# Patient Record
Sex: Female | Born: 1970
Health system: Southern US, Community
[De-identification: ages and names within clinical notes are randomized; demographics above are authoritative.]

## PROBLEM LIST (undated history)

## (undated) DIAGNOSIS — F419 Anxiety disorder, unspecified: Secondary | ICD-10-CM

## (undated) DIAGNOSIS — I288 Other diseases of pulmonary vessels: Secondary | ICD-10-CM

## (undated) DIAGNOSIS — G4700 Insomnia, unspecified: Secondary | ICD-10-CM

## (undated) DIAGNOSIS — F32A Depression, unspecified: Secondary | ICD-10-CM

## (undated) DIAGNOSIS — R42 Dizziness and giddiness: Secondary | ICD-10-CM

## (undated) DIAGNOSIS — R0683 Snoring: Secondary | ICD-10-CM

## (undated) DIAGNOSIS — H9190 Unspecified hearing loss, unspecified ear: Secondary | ICD-10-CM

## (undated) DIAGNOSIS — E119 Type 2 diabetes mellitus without complications: Secondary | ICD-10-CM

## (undated) DIAGNOSIS — IMO0002 Reserved for concepts with insufficient information to code with codable children: Secondary | ICD-10-CM

## (undated) DIAGNOSIS — G8929 Other chronic pain: Secondary | ICD-10-CM

## (undated) DIAGNOSIS — H9319 Tinnitus, unspecified ear: Secondary | ICD-10-CM

## (undated) DIAGNOSIS — F329 Major depressive disorder, single episode, unspecified: Secondary | ICD-10-CM

## (undated) DIAGNOSIS — I10 Essential (primary) hypertension: Secondary | ICD-10-CM

## (undated) HISTORY — PX: TUBAL LIGATION: SHX77

## (undated) HISTORY — DX: Reserved for concepts with insufficient information to code with codable children: IMO0002

---

## 2001-11-11 ENCOUNTER — Emergency Department (HOSPITAL_COMMUNITY): Admission: EM | Admit: 2001-11-11 | Discharge: 2001-11-11 | Payer: Self-pay | Admitting: Emergency Medicine

## 2002-12-12 ENCOUNTER — Other Ambulatory Visit: Admission: RE | Admit: 2002-12-12 | Discharge: 2002-12-12 | Payer: Self-pay | Admitting: Obstetrics and Gynecology

## 2002-12-19 ENCOUNTER — Ambulatory Visit (HOSPITAL_COMMUNITY): Admission: RE | Admit: 2002-12-19 | Discharge: 2002-12-19 | Payer: Self-pay | Admitting: Obstetrics and Gynecology

## 2003-02-05 ENCOUNTER — Encounter: Admission: RE | Admit: 2003-02-05 | Discharge: 2003-02-05 | Payer: Self-pay | Admitting: Obstetrics and Gynecology

## 2004-04-27 ENCOUNTER — Other Ambulatory Visit: Admission: RE | Admit: 2004-04-27 | Discharge: 2004-04-27 | Payer: Self-pay | Admitting: Obstetrics and Gynecology

## 2008-02-27 ENCOUNTER — Emergency Department (HOSPITAL_COMMUNITY): Admission: EM | Admit: 2008-02-27 | Discharge: 2008-02-27 | Payer: Self-pay | Admitting: Emergency Medicine

## 2008-04-25 ENCOUNTER — Emergency Department (HOSPITAL_COMMUNITY): Admission: EM | Admit: 2008-04-25 | Discharge: 2008-04-25 | Payer: Self-pay | Admitting: Family Medicine

## 2010-07-08 ENCOUNTER — Other Ambulatory Visit: Payer: Self-pay | Admitting: Internal Medicine

## 2010-07-08 DIAGNOSIS — E042 Nontoxic multinodular goiter: Secondary | ICD-10-CM

## 2010-07-12 ENCOUNTER — Ambulatory Visit
Admission: RE | Admit: 2010-07-12 | Discharge: 2010-07-12 | Disposition: A | Discharge status: Home / Self Care | Payer: 59 | Source: Ambulatory Visit | Attending: Internal Medicine | Admitting: Internal Medicine

## 2010-07-12 DIAGNOSIS — E042 Nontoxic multinodular goiter: Secondary | ICD-10-CM

## 2010-08-18 ENCOUNTER — Encounter (INDEPENDENT_AMBULATORY_CARE_PROVIDER_SITE_OTHER): Payer: Self-pay | Admitting: Surgery

## 2010-08-19 ENCOUNTER — Ambulatory Visit (INDEPENDENT_AMBULATORY_CARE_PROVIDER_SITE_OTHER): Payer: 59 | Admitting: Surgery

## 2010-08-19 ENCOUNTER — Encounter (INDEPENDENT_AMBULATORY_CARE_PROVIDER_SITE_OTHER): Payer: Self-pay | Admitting: Surgery

## 2010-08-19 VITALS — BP 150/108 | HR 82 | Temp 97.0°F | Ht 72.0 in | Wt 240.0 lb

## 2010-08-19 DIAGNOSIS — E041 Nontoxic single thyroid nodule: Secondary | ICD-10-CM

## 2010-08-19 NOTE — Progress Notes (Signed)
Subjective:     Patient ID: Holly Rodgers, female   DOB: 1970/10/13, 41 y.o.   MRN: [id number]    BP 150/108  Pulse 82  Temp 97 F (36.1 C)  Ht 6' (1.829 m)  Wt 240 lb (108.863 kg)  BMI 32.55 kg/m2    HPI This is a 40 year old female referred for a nodule on her right thyroid gland. She felt this herself approximately 2 months ago. She reports some intermittent hoarseness. She has no difficulty swallowing. She has no signs or symptoms of hyper or hypothyroidism. She otherwise has no complaints. She has had no previous biopsies or surgery on her neck. Past Medical History  Diagnosis Date  . Cyst     Past Surgical History  Procedure Date  . Tubal ligation    No Known Allergies  No current outpatient prescriptions on file.    History   Social History  . Marital Status: Legally Separated    Spouse Name: N/A    Number of Children: N/A  . Years of Education: N/A   Occupational History  . Not on file.   Social History Main Topics  . Smoking status: Never Smoker   . Smokeless tobacco: Not on file  . Alcohol Use: 8.4 oz/week    14 Cans of beer per week  . Drug Use: No  . Sexually Active:    Other Topics Concern  . Not on file   Social History Narrative  . No narrative on file     Review of Systems  Constitutional: Negative.   HENT: Negative.   Eyes: Negative.   Respiratory: Negative.   Cardiovascular: Negative.   Gastrointestinal: Negative.   Genitourinary: Negative.   Musculoskeletal: Negative.   Neurological: Negative.   Hematological: Negative.   Psychiatric/Behavioral: Negative.        Objective:   Physical Exam  Constitutional: She is oriented to person, place, and time. She appears well-developed and well-nourished. No distress.  HENT:  Head: Normocephalic and atraumatic.  Right Ear: External ear normal.  Left Ear: External ear normal.  Nose: Nose normal.  Mouth/Throat: Oropharynx is clear and moist. No oropharyngeal exudate.  Eyes: Pupils  are equal, round, and reactive to light. No scleral icterus.  Neck: Neck supple. No tracheal deviation present. Thyromegaly present.  Cardiovascular: Normal rate, regular rhythm and normal heart sounds.   No murmur heard. Pulmonary/Chest: Effort normal and breath sounds normal. No respiratory distress.  Lymphadenopathy:    She has no cervical adenopathy.  Neurological: She is alert and oriented to person, place, and time.  Skin: Skin is warm and dry. No rash noted.  Psychiatric: Her behavior is normal. Judgment and thought content normal.      Data reviewed: I have an ultrasound of the patient's thyroid gland. This shows an almost 4 cm cystic mass in the right upper thyroid lobe and a 3 cm mass which is solid in the right lower pole of the thyroid gland. The isthmus and left gland are normal. Assessment:       Thyroid nodule on the right thyroid gland Plan:     I would recommend fine-needle aspiration of both the cystic and the solid masses in the right groin. Should these showed follicular components or malignancy I would then recommend right thyroid lobectomy. I will schedule this under ultrasound guidance by the radiologist. I will see her back after this is performed.

## 2010-08-31 ENCOUNTER — Other Ambulatory Visit: Payer: 59

## 2010-09-08 ENCOUNTER — Other Ambulatory Visit: Payer: 59

## 2010-09-15 ENCOUNTER — Ambulatory Visit
Admission: RE | Admit: 2010-09-15 | Discharge: 2010-09-15 | Disposition: A | Discharge status: Home / Self Care | Payer: 59 | Source: Ambulatory Visit | Attending: Surgery | Admitting: Surgery

## 2010-09-15 ENCOUNTER — Other Ambulatory Visit (HOSPITAL_COMMUNITY)
Admission: RE | Admit: 2010-09-15 | Discharge: 2010-09-15 | Disposition: A | Discharge status: Home / Self Care | Payer: 59 | Source: Ambulatory Visit | Attending: Surgery | Admitting: Surgery

## 2010-09-15 DIAGNOSIS — E041 Nontoxic single thyroid nodule: Secondary | ICD-10-CM

## 2010-09-15 DIAGNOSIS — E049 Nontoxic goiter, unspecified: Secondary | ICD-10-CM | POA: Insufficient documentation

## 2010-10-07 ENCOUNTER — Telehealth (INDEPENDENT_AMBULATORY_CARE_PROVIDER_SITE_OTHER): Payer: Self-pay | Admitting: General Surgery

## 2010-10-07 NOTE — Telephone Encounter (Signed)
Left message on machine for pt to call back for her U/S Thyroid Bx. Pt to come in for a office visit to go over Bx or can give results over the phone

## 2017-12-05 ENCOUNTER — Inpatient Hospital Stay (HOSPITAL_BASED_OUTPATIENT_CLINIC_OR_DEPARTMENT_OTHER)
Admission: EM | Admit: 2017-12-05 | Discharge: 2017-12-11 | DRG: 175 | Disposition: A | Discharge status: Home Health | Payer: 59 | Attending: Internal Medicine | Admitting: Internal Medicine

## 2017-12-05 ENCOUNTER — Encounter (HOSPITAL_BASED_OUTPATIENT_CLINIC_OR_DEPARTMENT_OTHER): Payer: Self-pay

## 2017-12-05 ENCOUNTER — Emergency Department (HOSPITAL_BASED_OUTPATIENT_CLINIC_OR_DEPARTMENT_OTHER): Payer: 59

## 2017-12-05 ENCOUNTER — Other Ambulatory Visit: Payer: Self-pay

## 2017-12-05 DIAGNOSIS — E1169 Type 2 diabetes mellitus with other specified complication: Secondary | ICD-10-CM | POA: Diagnosis present

## 2017-12-05 DIAGNOSIS — F419 Anxiety disorder, unspecified: Secondary | ICD-10-CM | POA: Diagnosis present

## 2017-12-05 DIAGNOSIS — R0602 Shortness of breath: Secondary | ICD-10-CM | POA: Diagnosis not present

## 2017-12-05 DIAGNOSIS — Z6841 Body Mass Index (BMI) 40.0 and over, adult: Secondary | ICD-10-CM

## 2017-12-05 DIAGNOSIS — H9190 Unspecified hearing loss, unspecified ear: Secondary | ICD-10-CM | POA: Diagnosis present

## 2017-12-05 DIAGNOSIS — I2602 Saddle embolus of pulmonary artery with acute cor pulmonale: Principal | ICD-10-CM | POA: Diagnosis present

## 2017-12-05 DIAGNOSIS — Z86718 Personal history of other venous thrombosis and embolism: Secondary | ICD-10-CM

## 2017-12-05 DIAGNOSIS — E119 Type 2 diabetes mellitus without complications: Secondary | ICD-10-CM | POA: Diagnosis present

## 2017-12-05 DIAGNOSIS — Z86711 Personal history of pulmonary embolism: Secondary | ICD-10-CM

## 2017-12-05 DIAGNOSIS — I82432 Acute embolism and thrombosis of left popliteal vein: Secondary | ICD-10-CM | POA: Diagnosis present

## 2017-12-05 DIAGNOSIS — Z8249 Family history of ischemic heart disease and other diseases of the circulatory system: Secondary | ICD-10-CM

## 2017-12-05 DIAGNOSIS — B9620 Unspecified Escherichia coli [E. coli] as the cause of diseases classified elsewhere: Secondary | ICD-10-CM | POA: Diagnosis present

## 2017-12-05 DIAGNOSIS — Z833 Family history of diabetes mellitus: Secondary | ICD-10-CM

## 2017-12-05 DIAGNOSIS — Z888 Allergy status to other drugs, medicaments and biological substances status: Secondary | ICD-10-CM

## 2017-12-05 DIAGNOSIS — Z23 Encounter for immunization: Secondary | ICD-10-CM

## 2017-12-05 DIAGNOSIS — E669 Obesity, unspecified: Secondary | ICD-10-CM

## 2017-12-05 DIAGNOSIS — R911 Solitary pulmonary nodule: Secondary | ICD-10-CM | POA: Diagnosis present

## 2017-12-05 DIAGNOSIS — N390 Urinary tract infection, site not specified: Secondary | ICD-10-CM | POA: Diagnosis present

## 2017-12-05 DIAGNOSIS — Z87891 Personal history of nicotine dependence: Secondary | ICD-10-CM

## 2017-12-05 DIAGNOSIS — G8929 Other chronic pain: Secondary | ICD-10-CM | POA: Diagnosis present

## 2017-12-05 DIAGNOSIS — F329 Major depressive disorder, single episode, unspecified: Secondary | ICD-10-CM | POA: Diagnosis present

## 2017-12-05 DIAGNOSIS — I10 Essential (primary) hypertension: Secondary | ICD-10-CM | POA: Diagnosis present

## 2017-12-05 HISTORY — DX: Anxiety disorder, unspecified: F41.9

## 2017-12-05 HISTORY — DX: Type 2 diabetes mellitus without complications: E11.9

## 2017-12-05 HISTORY — DX: Insomnia, unspecified: G47.00

## 2017-12-05 HISTORY — DX: Snoring: R06.83

## 2017-12-05 HISTORY — DX: Essential (primary) hypertension: I10

## 2017-12-05 HISTORY — DX: Major depressive disorder, single episode, unspecified: F32.9

## 2017-12-05 HISTORY — DX: Dizziness and giddiness: R42

## 2017-12-05 HISTORY — DX: Unspecified hearing loss, unspecified ear: H91.90

## 2017-12-05 HISTORY — DX: Depression, unspecified: F32.A

## 2017-12-05 HISTORY — DX: Other chronic pain: G89.29

## 2017-12-05 HISTORY — DX: Other diseases of pulmonary vessels: I28.8

## 2017-12-05 HISTORY — DX: Tinnitus, unspecified ear: H93.19

## 2017-12-05 NOTE — ED Provider Notes (Signed)
[facility] Provider Note: [name], MD, FACEP  CSN: [id number] MRN: [id number] ARRIVAL: 12/05/17 at 2017 ROOM: [facility]09/[facility]09   CHIEF COMPLAINT  Shortness of Breath   HISTORY OF PRESENT ILLNESS  12/05/17 11:48 PM Holly Rodgers is a 47 y.o. female with a history of morbid obesity.  She is here with a 3-day history of increased edema of her lower extremities, left greater than right.  She states she is having severe pain in her left calf which feels deep.  She is also having pain in her right popliteal fossa.  She also complains of shortness of breath, worse with exertion.  Symptoms are moderate.  She denies chest pain.  She has had nausea and some diarrhea but no vomiting.    Past Medical History:  Diagnosis Date  . Anxiety   . Chronic pain   . Cyst   . Depression   . Diabetes mellitus without complication (HCC)   . Hearing loss   . Hypertension   . Idiopathic dilatation of pulmonary artery (HCC)   . Tinnitus   . Vertigo     Past Surgical History:  Procedure Laterality Date  . TUBAL LIGATION      Family History  Problem Relation Age of Onset  . Diabetes Mother   . Heart disease Mother     Social History   Tobacco Use  . Smoking status: Never Smoker  . Smokeless tobacco: Never Used  Substance Use Topics  . Alcohol use: Not Currently  . Drug use: No    Prior to Admission medications   Medication Sig Start Date End Date Taking? Authorizing Provider  diclofenac (VOLTAREN) 50 MG EC tablet Take 50 mg by mouth 2 (two) times daily.   Yes [provider]  diltiazem (CARDIZEM CD) 300 MG 24 hr capsule Take 300 mg by mouth daily.   Yes [provider]  gabapentin (NEURONTIN) 600 MG tablet Take 600 mg by mouth 3 (three) times daily.   Yes [provider]  medroxyPROGESTERone (PROVERA) 10 MG tablet Take 10 mg by mouth daily.   Yes [provider]  metoprolol succinate (TOPROL-XL) 50 MG 24 hr tablet Take 50 mg by  mouth daily. Take with or immediately following a meal.   Yes [provider]  sertraline (ZOLOFT) 100 MG tablet Take 100 mg by mouth daily.   Yes [provider]  tiZANidine (ZANAFLEX) 4 MG capsule Take 4 mg by mouth 3 (three) times daily.   Yes [provider]    Allergies Metformin and related   REVIEW OF SYSTEMS  Negative except as noted here or in the History of Present Illness.   PHYSICAL EXAMINATION  Initial Vital Signs Blood pressure (!) 164/101, pulse 92, temperature 98.7 F (37.1 C), temperature source Oral, resp. rate 19, height 5\' 11"  (1.803 m), weight (!) 190.5 kg, last menstrual period 11/23/2017, SpO2 96 %.  Examination General: Well-developed, obese female in no acute distress; appearance consistent with age of record HENT: normocephalic; atraumatic Eyes: pupils equal, round and reactive to light; extraocular muscles intact Neck: supple Heart: regular rate and rhythm; no murmur Lungs: clear to auscultation bilaterally Abdomen: soft; these; nontender; bowel sounds present Extremities: No deformity; full range of motion; edema of lower extremities, left greater than right with tenderness of left calf and right popliteal fossa Neurologic: Awake, alert and oriented; motor function intact in all extremities and symmetric; no facial droop Skin: Warm and dry Psychiatric: Normal mood and affect  RESULTS  Summary of this visit's results, reviewed by myself:   EKG Interpretation  Date/Time:  Tuesday December 05 2017 20:38:29 EDT Ventricular Rate:  88 PR Interval:  158 QRS Duration: 84 QT Interval:  358 QTC Calculation: 433 R Axis:   72 Text Interpretation:  Normal sinus rhythm Left ventricular hypertrophy Nonspecific ST abnormality Abnormal ECG No previous ECGs available Confirmed by Ebany Bowermaster, Jonny Ruiz ([id number]) on 12/05/2017 11:51:51 PM      Laboratory Studies: Results for orders placed or performed during the hospital encounter of 12/05/17  (from the past 24 hour(s))  CBC with Differential/Platelet     Status: Abnormal   Collection Time: 12/06/17 12:10 AM  Result Value Ref Range   WBC 10.2 4.0 - 10.5 K/uL   RBC 4.52 3.87 - 5.11 MIL/uL   Hemoglobin 10.8 (L) 12.0 - 15.0 g/dL   HCT 40.9 (L) 81.1 - 91.4 %   MCV 79.2 (L) 80.0 - 100.0 fL   MCH 23.9 (L) 26.0 - 34.0 pg   MCHC 30.2 30.0 - 36.0 g/dL   RDW 78.2 (H) 95.6 - 21.3 %   Platelets 215 150 - 400 K/uL   nRBC 0.2 0.0 - 0.2 %   Neutrophils Relative % 74 %   Neutro Abs 7.6 1.7 - 7.7 K/uL   Lymphocytes Relative 18 %   Lymphs Abs 1.8 0.7 - 4.0 K/uL   Monocytes Relative 6 %   Monocytes Absolute 0.6 0.1 - 1.0 K/uL   Eosinophils Relative 1 %   Eosinophils Absolute 0.1 0.0 - 0.5 K/uL   Basophils Relative 0 %   Basophils Absolute 0.0 0.0 - 0.1 K/uL   Immature Granulocytes 1 %   Abs Immature Granulocytes 0.05 0.00 - 0.07 K/uL  Basic metabolic panel     Status: Abnormal   Collection Time: 12/06/17 12:10 AM  Result Value Ref Range   Sodium 136 135 - 145 mmol/L   Potassium 3.6 3.5 - 5.1 mmol/L   Chloride 101 98 - 111 mmol/L   CO2 25 22 - 32 mmol/L   Glucose, Bld 123 (H) 70 - 99 mg/dL   BUN 14 6 - 20 mg/dL   Creatinine, Ser 0.86 0.44 - 1.00 mg/dL   Calcium 9.1 8.9 - 57.8 mg/dL   GFR calc non Af Amer >60 >60 mL/min   GFR calc Af Amer >60 >60 mL/min   Anion gap 10 5 - 15  Brain natriuretic peptide     Status: Abnormal   Collection Time: 12/06/17 12:10 AM  Result Value Ref Range   B Natriuretic Peptide 122.3 (H) 0.0 - 100.0 pg/mL  D-dimer, quantitative (not at [facility])     Status: Abnormal   Collection Time: 12/06/17 12:10 AM  Result Value Ref Range   D-Dimer, Quant 14.23 (H) 0.00 - 0.50 ug/mL-FEU  Troponin I     Status: Abnormal   Collection Time: 12/06/17 12:10 AM  Result Value Ref Range   Troponin I 0.07 (HH) <0.03 ng/mL   Imaging Studies: Dg Chest 2 View  Result Date: 12/05/2017 CLINICAL DATA:  Shortness of breath and dizziness EXAM: CHEST - 2 VIEW COMPARISON:   September 18, 2017 FINDINGS: The heart size and mediastinal contours are within normal limits. Both lungs are clear. The visualized skeletal structures are unremarkable. IMPRESSION: No active cardiopulmonary disease. Electronically Signed   By: Sherian Rein M.D.   On: 12/05/2017 21:44   Ct Angio Chest Pe W And/or Wo Contrast  Result Date: 12/06/2017 CLINICAL DATA:  48 year old female with  shortness of breath. Concern for pulmonary embolism. EXAM: CT ANGIOGRAPHY CHEST WITH CONTRAST TECHNIQUE: Multidetector CT imaging of the chest was performed using the standard protocol during bolus administration of intravenous contrast. Multiplanar CT image reconstructions and MIPs were obtained to evaluate the vascular anatomy. CONTRAST:  ISOVUE-370 IOPAMIDOL (ISOVUE-370) INJECTION 76% COMPARISON:  Chest radiograph dated 12/05/2017 and CT dated 04/14/2017 FINDINGS: Cardiovascular: There is no cardiomegaly or pericardial effusion. The thoracic aorta is unremarkable. There is a nonocclusive bandlike embolus straddling the bifurcation of the main pulmonary trunk and extending to the central pulmonary arteries consistent with a saddle embolus. There is occlusive thrombus in the lobar branches of the right upper lobe. Nonocclusive clot noted in the lower lobes bilaterally. There is mild enlargement of the right ventricle. The right ventricular diameter is 44.5 mm and the left ventricular diameter is 44 mm with a ratio of approximately 1 consistent with a degree of right heart straining. Mediastinum/Nodes: No hilar or mediastinal adenopathy. The esophagus is grossly unremarkable. There is asymmetric enlargement of the right thyroid lobe. Ultrasound may provide better evaluation on a nonemergent basis. No mediastinal fluid collection. Lungs/Pleura: No focal consolidation, pleural effusion, or pneumothorax. There is a 5 mm right lower lobe nodule (series 9, image 46) similar to prior CT. The central airways are patent. Upper  Abdomen: Apparent fatty infiltration of the liver. The visualized upper abdomen is otherwise unremarkable. Musculoskeletal: No chest wall abnormality. No acute or significant osseous findings. Review of the MIP images confirms the above findings. IMPRESSION: 1. Positive for acute PE with CT evidence of right heart strain (RV/LV Ratio = 1.5) consistent with at least submassive (intermediate risk) PE. The presence of right heart strain has been associated with an increased risk of morbidity and mortality. Please activate Code PE by paging [phone]. 2. Stable 5 mm right lower lobe pulmonary nodule. No follow-up needed if patient is low-risk. Non-contrast chest CT can be considered in 12 months if patient is high-risk. This recommendation follows the consensus statement: Guidelines for Management of Incidental Pulmonary Nodules Detected on CT Images: From the Fleischner Society 2017; Radiology 2017; 284:228-243. These results were called by telephone at the time of interpretation on 12/06/2017 at 2:37 am to Dr. Paula Libra , who verbally acknowledged these results. Electronically Signed   By: Elgie Collard M.D.   On: 12/06/2017 02:40   US Venous Img Lower Bilateral  Result Date: 12/06/2017 CLINICAL DATA:  Bilateral lower extremity pain and swelling left worse than right 4 days. Shortness of breath. EXAM: BILATERAL LOWER EXTREMITY VENOUS DOPPLER ULTRASOUND TECHNIQUE: Gray-scale sonography with graded compression, as well as color Doppler and duplex ultrasound were performed to evaluate the lower extremity deep venous systems from the level of the common femoral vein and including the common femoral, femoral, profunda femoral, popliteal and calf veins including the posterior tibial, peroneal and gastrocnemius veins when visible. The superficial great saphenous vein was also interrogated. Spectral Doppler was utilized to evaluate flow at rest and with distal augmentation maneuvers in the common femoral, femoral  and popliteal veins. COMPARISON:  None. FINDINGS: RIGHT LOWER EXTREMITY Common Femoral Vein: No evidence of thrombus. Normal compressibility, respiratory phasicity and response to augmentation. Saphenofemoral Junction: No evidence of thrombus. Normal compressibility and flow on color Doppler imaging. Profunda Femoral Vein: No evidence of thrombus. Normal compressibility and flow on color Doppler imaging. Femoral Vein: No evidence of thrombus. Normal compressibility, respiratory phasicity and response to augmentation. Popliteal Vein: No evidence of thrombus. Normal compressibility, respiratory phasicity and  response to augmentation. Calf Veins: No evidence of thrombus. Normal compressibility and flow on color Doppler imaging. Superficial Great Saphenous Vein: No evidence of thrombus. Normal compressibility. Venous Reflux:  None. Other Findings:  None. LEFT LOWER EXTREMITY Common Femoral Vein: No evidence of thrombus. Normal compressibility, respiratory phasicity and response to augmentation. Saphenofemoral Junction: No evidence of thrombus. Normal compressibility and flow on color Doppler imaging. Profunda Femoral Vein: No evidence of thrombus. Normal compressibility and flow on color Doppler imaging. Femoral Vein: No evidence of thrombus. Normal compressibility, respiratory phasicity and response to augmentation. Popliteal Vein: Short segment occlusive thrombus which is noncompressible. Calf Veins: No evidence of thrombus. Normal compressibility and flow on color Doppler imaging. Superficial Great Saphenous Vein: No evidence of thrombus. Normal compressibility. Venous Reflux:  None. Other Findings:  Left knee joint effusion. IMPRESSION: Occlusive thrombus left popliteal vein. No deep vein thrombosis within the right lower extremity. Critical Value/emergent results were called by telephone at the time of interpretation on 12/06/2017 at 2:06 am to Dr. Paula Libra , who verbally acknowledged these results.  Electronically Signed   By: Elberta Fortis M.D.   On: 12/06/2017 02:06    ED COURSE and MDM  Nursing notes and initial vitals signs, including pulse oximetry, reviewed.  Vitals:   12/05/17 2025 12/05/17 2034 12/05/17 2330 12/06/17 0145  BP:  (!) 195/94 (!) 164/101 (!) 160/79  Pulse:  (!) 102 92 88  Resp:  (!) 22 19 (!) 22  Temp:  98.7 F (37.1 C)    TempSrc:  Oral    SpO2:  96% 96% 90%  Weight: (!) 190.5 kg     Height: 5\' 11"  (1.803 m)      2:10 AM Lovenox ordered for DVT.  Suspect PE as well, CT angio chest results pending.  2:47 AM CT confirms suspected pulmonary embolism.  Will have patient admitted.   5:22 AM Patient has bed assigned at [facility], Dr. Julian Reil accepting.  PROCEDURES   CRITICAL CARE Performed by: Carlisle Beers Ily Denno Total critical care time: 35 minutes Critical care time was exclusive of separately billable procedures and treating other patients. Critical care was necessary to treat or prevent imminent or life-threatening deterioration. Critical care was time spent personally by me on the following activities: development of treatment plan with patient and/or surrogate as well as nursing, discussions with consultants, evaluation of patient's response to treatment, examination of patient, obtaining history from patient or surrogate, ordering and performing treatments and interventions, ordering and review of laboratory studies, ordering and review of radiographic studies, pulse oximetry and re-evaluation of patient's condition.   ED DIAGNOSES     ICD-10-CM   1. Acute saddle pulmonary embolism with acute cor pulmonale (HCC) I26.02   2. Acute deep vein thrombosis (DVT) of popliteal vein of left lower extremity (HCC) I82.432        Paula Libra, MD 12/06/17 1610

## 2017-12-05 NOTE — ED Triage Notes (Signed)
Pt with SOB, bilat LE swelling x 2-3 days-pt to triage in w/c-great effort and DOE to stand from w/c to scale

## 2017-12-06 ENCOUNTER — Emergency Department (HOSPITAL_BASED_OUTPATIENT_CLINIC_OR_DEPARTMENT_OTHER): Payer: 59

## 2017-12-06 ENCOUNTER — Inpatient Hospital Stay (HOSPITAL_COMMUNITY): Payer: 59

## 2017-12-06 ENCOUNTER — Encounter (HOSPITAL_COMMUNITY): Payer: Self-pay | Admitting: Internal Medicine

## 2017-12-06 DIAGNOSIS — E1169 Type 2 diabetes mellitus with other specified complication: Secondary | ICD-10-CM | POA: Diagnosis present

## 2017-12-06 DIAGNOSIS — G8929 Other chronic pain: Secondary | ICD-10-CM | POA: Diagnosis present

## 2017-12-06 DIAGNOSIS — N390 Urinary tract infection, site not specified: Secondary | ICD-10-CM | POA: Diagnosis present

## 2017-12-06 DIAGNOSIS — B9620 Unspecified Escherichia coli [E. coli] as the cause of diseases classified elsewhere: Secondary | ICD-10-CM | POA: Diagnosis present

## 2017-12-06 DIAGNOSIS — R911 Solitary pulmonary nodule: Secondary | ICD-10-CM | POA: Diagnosis present

## 2017-12-06 DIAGNOSIS — Z888 Allergy status to other drugs, medicaments and biological substances status: Secondary | ICD-10-CM | POA: Diagnosis not present

## 2017-12-06 DIAGNOSIS — E119 Type 2 diabetes mellitus without complications: Secondary | ICD-10-CM | POA: Diagnosis present

## 2017-12-06 DIAGNOSIS — I2602 Saddle embolus of pulmonary artery with acute cor pulmonale: Principal | ICD-10-CM

## 2017-12-06 DIAGNOSIS — Z23 Encounter for immunization: Secondary | ICD-10-CM | POA: Diagnosis not present

## 2017-12-06 DIAGNOSIS — I361 Nonrheumatic tricuspid (valve) insufficiency: Secondary | ICD-10-CM

## 2017-12-06 DIAGNOSIS — I10 Essential (primary) hypertension: Secondary | ICD-10-CM | POA: Diagnosis present

## 2017-12-06 DIAGNOSIS — R0602 Shortness of breath: Secondary | ICD-10-CM | POA: Diagnosis present

## 2017-12-06 DIAGNOSIS — Z6841 Body Mass Index (BMI) 40.0 and over, adult: Secondary | ICD-10-CM

## 2017-12-06 DIAGNOSIS — Z87891 Personal history of nicotine dependence: Secondary | ICD-10-CM | POA: Diagnosis not present

## 2017-12-06 DIAGNOSIS — H9190 Unspecified hearing loss, unspecified ear: Secondary | ICD-10-CM | POA: Diagnosis present

## 2017-12-06 DIAGNOSIS — Z8249 Family history of ischemic heart disease and other diseases of the circulatory system: Secondary | ICD-10-CM | POA: Diagnosis not present

## 2017-12-06 DIAGNOSIS — Z86718 Personal history of other venous thrombosis and embolism: Secondary | ICD-10-CM | POA: Diagnosis not present

## 2017-12-06 DIAGNOSIS — Z833 Family history of diabetes mellitus: Secondary | ICD-10-CM | POA: Diagnosis not present

## 2017-12-06 DIAGNOSIS — E669 Obesity, unspecified: Secondary | ICD-10-CM

## 2017-12-06 DIAGNOSIS — F329 Major depressive disorder, single episode, unspecified: Secondary | ICD-10-CM | POA: Diagnosis present

## 2017-12-06 DIAGNOSIS — I82432 Acute embolism and thrombosis of left popliteal vein: Secondary | ICD-10-CM | POA: Diagnosis present

## 2017-12-06 DIAGNOSIS — F419 Anxiety disorder, unspecified: Secondary | ICD-10-CM | POA: Diagnosis present

## 2017-12-06 DIAGNOSIS — Z86711 Personal history of pulmonary embolism: Secondary | ICD-10-CM | POA: Diagnosis not present

## 2017-12-06 LAB — BASIC METABOLIC PANEL
Anion gap: 10 (ref 5–15)
BUN: 14 mg/dL (ref 6–20)
CO2: 25 mmol/L (ref 22–32)
Calcium: 9.1 mg/dL (ref 8.9–10.3)
Chloride: 101 mmol/L (ref 98–111)
Creatinine, Ser: 0.87 mg/dL (ref 0.44–1.00)
GFR calc Af Amer: >60 mL/min (ref >60)
GFR calc non Af Amer: >60 mL/min (ref >60)
Glucose, Bld: 123 mg/dL — ABNORMAL HIGH (ref 70–99)
Potassium: 3.6 mmol/L (ref 3.5–5.1)
Sodium: 136 mmol/L (ref 135–145)

## 2017-12-06 LAB — TROPONIN I
Troponin I: 0.07 ng/mL (ref <0.03)
Troponin I: 0.03 ng/mL (ref <0.03)

## 2017-12-06 LAB — CBC WITH DIFFERENTIAL/PLATELET
Abs Immature Granulocytes: 0.05 10*3/uL (ref 0.00–0.07)
Basophils Absolute: 0 10*3/uL (ref 0.0–0.1)
Basophils Relative: 0 %
Eosinophils Absolute: 0.1 10*3/uL (ref 0.0–0.5)
Eosinophils Relative: 1 %
HCT: 35.8 % — ABNORMAL LOW (ref 36.0–46.0)
Hemoglobin: 10.8 g/dL — ABNORMAL LOW (ref 12.0–15.0)
Immature Granulocytes: 1 %
Lymphocytes Relative: 18 %
Lymphs Abs: 1.8 10*3/uL (ref 0.7–4.0)
MCH: 23.9 pg — ABNORMAL LOW (ref 26.0–34.0)
MCHC: 30.2 g/dL (ref 30.0–36.0)
MCV: 79.2 fL — ABNORMAL LOW (ref 80.0–100.0)
Monocytes Absolute: 0.6 10*3/uL (ref 0.1–1.0)
Monocytes Relative: 6 %
Neutro Abs: 7.6 10*3/uL (ref 1.7–7.7)
Neutrophils Relative %: 74 %
Platelets: 215 10*3/uL (ref 150–400)
RBC: 4.52 MIL/uL (ref 3.87–5.11)
RDW: 18.6 % — ABNORMAL HIGH (ref 11.5–15.5)
WBC: 10.2 10*3/uL (ref 4.0–10.5)
nRBC: 0.2 % (ref 0.0–0.2)

## 2017-12-06 LAB — D-DIMER, QUANTITATIVE: D-Dimer, Quant: 14.23 ug/mL-FEU — ABNORMAL HIGH (ref 0.00–0.50)

## 2017-12-06 LAB — GLUCOSE, CAPILLARY
GLUCOSE-CAPILLARY: 96 mg/dL (ref 70–99)
Glucose-Capillary: 144 mg/dL — ABNORMAL HIGH (ref 70–99)
Glucose-Capillary: 115 mg/dL — ABNORMAL HIGH (ref 70–99)

## 2017-12-06 LAB — BRAIN NATRIURETIC PEPTIDE: B Natriuretic Peptide: 122.3 pg/mL — ABNORMAL HIGH (ref 0.0–100.0)

## 2017-12-06 LAB — HEMOGLOBIN A1C
Hgb A1c MFr Bld: 7.4 % — ABNORMAL HIGH (ref 4.8–5.6)
MEAN PLASMA GLUCOSE: 165.68 mg/dL

## 2017-12-06 LAB — HEPARIN LEVEL (UNFRACTIONATED)
HEPARIN UNFRACTIONATED: 0.58 [IU]/mL (ref 0.30–0.70)
HEPARIN UNFRACTIONATED: 0.27 [IU]/mL — AB (ref 0.30–0.70)

## 2017-12-06 LAB — ECHOCARDIOGRAM COMPLETE
Height: 71 in
Weight: 6720 oz

## 2017-12-06 LAB — HEPARIN ANTI-XA: HEPARIN LMW: 1.11 [IU]/mL

## 2017-12-06 MED ORDER — MORPHINE SULFATE (PF) 2 MG/ML IV SOLN
2.0000 mg | INTRAVENOUS | Status: DC | PRN
  Administered 2017-12-07 – 2017-12-08 (×4): 2 mg via INTRAVENOUS
  Filled 2017-12-06 (×4): qty 1

## 2017-12-06 MED ORDER — ONDANSETRON HCL 4 MG/2ML IJ SOLN
4.0000 mg | Freq: Four times a day (QID) | INTRAMUSCULAR | Status: DC | PRN
  Administered 2017-12-06 – 2017-12-08 (×2): 4 mg via INTRAVENOUS
  Filled 2017-12-06 (×2): qty 2

## 2017-12-06 MED ORDER — FENTANYL CITRATE (PF) 100 MCG/2ML IJ SOLN
100.0000 ug | Freq: Once | INTRAMUSCULAR | Status: AC
  Administered 2017-12-06: 100 ug via INTRAVENOUS
  Filled 2017-12-06: qty 2

## 2017-12-06 MED ORDER — ONDANSETRON HCL 4 MG PO TABS
4.0000 mg | ORAL_TABLET | Freq: Four times a day (QID) | ORAL | Status: DC | PRN
  Administered 2017-12-07: 4 mg via ORAL
  Filled 2017-12-06: qty 1

## 2017-12-06 MED ORDER — GABAPENTIN 600 MG PO TABS
600.0000 mg | ORAL_TABLET | Freq: Three times a day (TID) | ORAL | Status: DC
  Administered 2017-12-06 – 2017-12-11 (×17): 600 mg via ORAL
  Filled 2017-12-06 (×17): qty 1

## 2017-12-06 MED ORDER — ENOXAPARIN SODIUM 300 MG/3ML IJ SOLN
1.0000 mg/kg | Freq: Once | INTRAMUSCULAR | Status: DC
  Filled 2017-12-06: qty 1.91

## 2017-12-06 MED ORDER — HEPARIN (PORCINE) IN NACL 100-0.45 UNIT/ML-% IJ SOLN
2000.0000 [IU]/h | INTRAMUSCULAR | Status: DC
  Administered 2017-12-06: 1000 [IU]/h via INTRAVENOUS
  Administered 2017-12-07: 1600 [IU]/h via INTRAVENOUS
  Filled 2017-12-06 (×3): qty 250

## 2017-12-06 MED ORDER — ONDANSETRON HCL 4 MG/2ML IJ SOLN
4.0000 mg | Freq: Once | INTRAMUSCULAR | Status: AC
  Administered 2017-12-06: 4 mg via INTRAVENOUS
  Filled 2017-12-06: qty 2

## 2017-12-06 MED ORDER — HYDROMORPHONE HCL 1 MG/ML IJ SOLN
1.0000 mg | Freq: Once | INTRAMUSCULAR | Status: AC
  Administered 2017-12-06: 1 mg via INTRAVENOUS
  Filled 2017-12-06: qty 1

## 2017-12-06 MED ORDER — INFLUENZA VAC SPLIT QUAD 0.5 ML IM SUSY: 0.5000 mL | PREFILLED_SYRINGE | INTRAMUSCULAR | Status: DC

## 2017-12-06 MED ORDER — IOPAMIDOL (ISOVUE-370) INJECTION 76%
125.0000 mL | Freq: Once | INTRAVENOUS | Status: AC | PRN
  Administered 2017-12-06: 125 mL via INTRAVENOUS

## 2017-12-06 MED ORDER — ACETAMINOPHEN 650 MG RE SUPP: 650.0000 mg | Freq: Four times a day (QID) | RECTAL | Status: DC | PRN

## 2017-12-06 MED ORDER — SERTRALINE HCL 100 MG PO TABS
100.0000 mg | ORAL_TABLET | Freq: Every day | ORAL | Status: DC
  Administered 2017-12-06 – 2017-12-11 (×6): 100 mg via ORAL
  Filled 2017-12-06 (×6): qty 1

## 2017-12-06 MED ORDER — TIZANIDINE HCL 4 MG PO TABS
4.0000 mg | ORAL_TABLET | Freq: Two times a day (BID) | ORAL | Status: DC | PRN
  Administered 2017-12-06 – 2017-12-07 (×2): 4 mg via ORAL
  Filled 2017-12-06 (×2): qty 1

## 2017-12-06 MED ORDER — FENTANYL CITRATE (PF) 100 MCG/2ML IJ SOLN
100.0000 ug | Freq: Once | INTRAMUSCULAR | Status: AC
Start: 2017-12-06 — End: 2017-12-06
  Administered 2017-12-06: 100 ug via INTRAVENOUS
  Filled 2017-12-06: qty 2

## 2017-12-06 MED ORDER — SODIUM CHLORIDE 0.9% FLUSH
3.0000 mL | Freq: Two times a day (BID) | INTRAVENOUS | Status: DC
  Administered 2017-12-06 – 2017-12-11 (×8): 3 mL via INTRAVENOUS

## 2017-12-06 MED ORDER — DILTIAZEM HCL ER COATED BEADS 180 MG PO CP24
300.0000 mg | ORAL_CAPSULE | Freq: Every day | ORAL | Status: DC
  Administered 2017-12-06 – 2017-12-11 (×6): 300 mg via ORAL
  Filled 2017-12-06 (×6): qty 1

## 2017-12-06 MED ORDER — ACETAMINOPHEN 325 MG PO TABS
650.0000 mg | ORAL_TABLET | Freq: Four times a day (QID) | ORAL | Status: DC | PRN
  Administered 2017-12-06 – 2017-12-11 (×3): 650 mg via ORAL
  Filled 2017-12-06 (×3): qty 2

## 2017-12-06 MED ORDER — DOCUSATE SODIUM 100 MG PO CAPS
100.0000 mg | ORAL_CAPSULE | Freq: Two times a day (BID) | ORAL | Status: DC
  Administered 2017-12-06 – 2017-12-10 (×9): 100 mg via ORAL
  Filled 2017-12-06 (×11): qty 1

## 2017-12-06 MED ORDER — HYDROMORPHONE HCL 1 MG/ML IJ SOLN
1.0000 mg | INTRAMUSCULAR | Status: AC | PRN
  Administered 2017-12-06 – 2017-12-07 (×4): 1 mg via INTRAVENOUS
  Filled 2017-12-06 (×4): qty 1

## 2017-12-06 MED ORDER — ENOXAPARIN SODIUM 100 MG/ML ~~LOC~~ SOLN
SUBCUTANEOUS | Status: AC
  Administered 2017-12-06: 191 mg
  Filled 2017-12-06: qty 2

## 2017-12-06 MED ORDER — PNEUMOCOCCAL VAC POLYVALENT 25 MCG/0.5ML IJ INJ
0.5000 mL | INJECTION | INTRAMUSCULAR | Status: AC
  Administered 2017-12-11: 0.5 mL via INTRAMUSCULAR

## 2017-12-06 MED ORDER — PERFLUTREN LIPID MICROSPHERE
1.0000 mL | INTRAVENOUS | Status: AC | PRN
  Administered 2017-12-06: 2 mL via INTRAVENOUS
  Filled 2017-12-06: qty 10

## 2017-12-06 MED ORDER — INSULIN ASPART 100 UNIT/ML ~~LOC~~ SOLN
0.0000 [IU] | Freq: Three times a day (TID) | SUBCUTANEOUS | Status: DC
  Administered 2017-12-06: 3 [IU] via SUBCUTANEOUS
  Administered 2017-12-07: 4 [IU] via SUBCUTANEOUS
  Administered 2017-12-07 (×2): 3 [IU] via SUBCUTANEOUS
  Administered 2017-12-08 – 2017-12-10 (×3): 4 [IU] via SUBCUTANEOUS
  Administered 2017-12-11: 3 [IU] via SUBCUTANEOUS

## 2017-12-06 MED ORDER — LACTATED RINGERS IV SOLN
INTRAVENOUS | Status: DC
  Administered 2017-12-06: 11:00:00 via INTRAVENOUS

## 2017-12-06 MED ORDER — METOPROLOL SUCCINATE ER 50 MG PO TB24
50.0000 mg | ORAL_TABLET | Freq: Every day | ORAL | Status: DC
  Administered 2017-12-06 – 2017-12-11 (×6): 50 mg via ORAL
  Filled 2017-12-06 (×6): qty 1

## 2017-12-06 NOTE — Progress Notes (Signed)
ANTICOAGULATION CONSULT NOTE  Pharmacy Consult for Heparin Indication: pulmonary embolus  Allergies  Allergen Reactions  . Metformin     Other reaction(s): GI Upset (intolerance)  . Metformin And Related     Patient Measurements: Height: 5\' 11"  (180.3 cm) Weight: (!) 420 lb (190.5 kg) IBW/kg (Calculated) : 70.8 Heparin Dosing Weight: 120 kg  Vital Signs: Temp: 98.7 F (37.1 C) (10/30 1400) BP: 160/87 (10/30 1400) Pulse Rate: 87 (10/30 1400)  Labs: Recent Labs    12/06/17 0010 12/06/17 0627 12/06/17 1035 12/06/17 1524  HGB 10.8*  --   --   --   HCT 35.8*  --   --   --   PLT 215  --   --   --   HEPARINUNFRC  --   --   --  0.58  HEPRLOWMOCWT  --  1.11  --   --   CREATININE 0.87  --   --   --   TROPONINI 0.07*  --  <0.03  --     Estimated Creatinine Clearance: 149.8 mL/min (by C-G formula based on SCr of 0.87 mg/dL).    Assessment: Holly Rodgers with to continue on IV heparin for saddle PE.  Heparin level is therapeutic; no bleeding reported.  Goal of Therapy:  Heparin level 0.3-0.7 units/ml Monitor platelets by anticoagulation protocol: Yes   Plan:  Continue heparin gtt at 2000 units/hr Check confirmatory heparin level  Holly Rodgers D. Laney Potash, PharmD, BCPS, BCCCP 12/06/2017, 4:29 PM

## 2017-12-06 NOTE — Progress Notes (Addendum)
ANTICOAGULATION CONSULT NOTE  Pharmacy Consult for Heparin Indication: pulmonary embolus  Allergies  Allergen Reactions  . Metformin     Other reaction(s): GI Upset (intolerance)  . Metformin And Related     Patient Measurements: Height: 5\' 11"  (180.3 cm) Weight: (!) 420 lb (190.5 kg) IBW/kg (Calculated) : 70.8 Heparin Dosing Weight: 120 kg  Vital Signs: Temp: 98.2 F (36.8 C) (10/30 1943) Temp Source: Oral (10/30 1943) BP: 138/72 (10/30 1943) Pulse Rate: 87 (10/30 1943)  Labs: Recent Labs    12/06/17 0010 12/06/17 0627 12/06/17 1035 12/06/17 1524 12/06/17 2109  HGB 10.8*  --   --   --   --   HCT 35.8*  --   --   --   --   PLT 215  --   --   --   --   HEPARINUNFRC  --   --   --  0.58 0.27*  HEPRLOWMOCWT  --  1.11  --   --   --   CREATININE 0.87  --   --   --   --   TROPONINI 0.07*  --  <0.03 <0.03  --     Estimated Creatinine Clearance: 149.8 mL/min (by C-G formula based on SCr of 0.87 mg/dL).    [name] with to continue on IV heparin for saddle PE.  Heparin level is 0. 27 units/ml on 1000 units/hr heparin.  Goal of Therapy:  Heparin level 0.3-0.7 units/ml Monitor platelets by anticoagulation protocol: Yes   Plan:   Increase heparin gtt to 1200 units/hr Heparin level and CBC daily Monitor for bleeding complications  Thanks for allowing pharmacy to be a part of this patient's care.  Talbert Cage, PharmD Clinical Pharmacist  12/06/2017, 10:05 PM   Addum:  Heparin level now 0.24.  Will increase heparin drip to 1500 units/hr.  Check heparin level 6 hours after rate change.

## 2017-12-06 NOTE — ED Notes (Signed)
Patient transported to Ultrasound 

## 2017-12-06 NOTE — Progress Notes (Signed)
Interventional Radiology Note   47 year old female with acute PE, submassive/intermediate risk category.    CTA shows bilateral PE, and RV/LV ratio of 1.5.    Troponin and BNP are elevated.    She is currently not at [facility], still at [facility].   Called and discussed with caring nurse at [facility], with estimated ETA to [facility] ~7am. Presents with left leg pain and SOB.  Report of positive duplex for left leg DVT.  No history of syncope, and no history of hypotension.  No recent surgery, stroke, or GI bleed.    Current Vital signs: 2L 99%, SBP 137/80, HR 88.   Has received therapeutic lovenox dose.    No clinical change/hypotension to change her risk category.   Remains submassive/intermediate risk category. Stable.   VIR to see and discuss possible catheter directed thrombolysis.   Signed,  Yvone Neu. Loreta Ave, DO

## 2017-12-06 NOTE — H&P (Addendum)
History and Physical    Holly Rodgers:[id number] DOB: 1970/05/17 DOA: 12/05/2017  PCP:  [name], [place] Consultants:  [facility] - neurology/pain management Patient coming from:  Home - lives with boyfriend; NOK: Boyfriend, Ethelene Browns [phone]   Chief Complaint: SOB  HPI: Holly Rodgers is a 47 y.o. female with medical history significant of HTN; DM; depression/anxiety; and chronic pain presenting with  SOB.  She was having SOB and severe left leg pain and swelling. Also pain in her right leg.  When she tries to walk from bed to bathroom, she was totally out of breath and saw spots, feeling like she would pass out.  Her symptoms started about 4 days ago but worsened yesterday.  No recent surgery, no long car trips or plane rides.  With her back and knee pain, she has very limited mobility.  Her usual level of activity is sitting and transferring bed to chair and bathroom.  She is SOB with any movement, even moving around in the bed.  She is fine while lying in bed.  No cough.  No h/o prior VTE.  No known FH of VTE.  No fevers.  Her symptoms are not improved since arrival in the ER - although the pain eased off to 8/10.  She does complain of a mild burning sensation with urination with some hesitancy and frequency.   ED Course: [facility] transfer - Submassive PE but hemodynamically stable.  IR to evaluate for possible catheter-directed thrombolysis.  Given treatment-dose Lovenox, PCCM notified.  Review of Systems: As per HPI; otherwise review of systems reviewed and negative.   Ambulatory Status:  Ambulates with a cane or walker  Past Medical History:  Diagnosis Date  . Anxiety   . Chronic pain   . Cyst   . Depression   . Diabetes mellitus without complication (HCC)   . Hearing loss   . Hypertension   . Idiopathic dilatation of pulmonary artery (HCC)   . Insomnia   . Snoring   . Tinnitus   . Vertigo     Past Surgical History:  Procedure Laterality Date  . TUBAL  LIGATION      Social History   Socioeconomic History  . Marital status: Legally Separated    Spouse name: Not on file  . Number of children: Not on file  . Years of education: Not on file  . Highest education level: Not on file  Occupational History  . Not on file  Social Needs  . Financial resource strain: Not on file  . Food insecurity:    Worry: Not on file    Inability: Not on file  . Transportation needs:    Medical: Not on file    Non-medical: Not on file  Tobacco Use  . Smoking status: Never Smoker  . Smokeless tobacco: Never Used  Substance and Sexual Activity  . Alcohol use: Not Currently  . Drug use: No  . Sexual activity: Not on file  Lifestyle  . Physical activity:    Days per week: Not on file    Minutes per session: Not on file  . Stress: Not on file  Relationships  . Social connections:    Talks on phone: Not on file    Gets together: Not on file    Attends religious service: Not on file    Active member of club or organization: Not on file    Attends meetings of clubs or organizations: Not on file    Relationship status:  Not on file  . Intimate partner violence:    Fear of current or ex partner: Not on file    Emotionally abused: Not on file    Physically abused: Not on file    Forced sexual activity: Not on file  Other Topics Concern  . Not on file  Social History Narrative  . Not on file    Allergies  Allergen Reactions  . Metformin     Other reaction(s): GI Upset (intolerance)  . Metformin And Related     Family History  Problem Relation Age of Onset  . Diabetes Mother   . Heart disease Mother   . Pulmonary embolism Neg Hx     Prior to Admission medications   Medication Sig Start Date End Date Taking? Authorizing Provider  diclofenac (VOLTAREN) 50 MG EC tablet Take 50 mg by mouth 2 (two) times daily.   Yes [provider]  diltiazem (CARDIZEM CD) 300 MG 24 hr capsule Take 300 mg by mouth daily.   Yes [provider]  gabapentin (NEURONTIN) 600 MG tablet Take 600 mg by mouth 3 (three) times daily.   Yes [provider]  medroxyPROGESTERone (PROVERA) 10 MG tablet Take 10 mg by mouth daily.   Yes [provider]  metoprolol succinate (TOPROL-XL) 50 MG 24 hr tablet Take 50 mg by mouth daily. Take with or immediately following a meal.   Yes [provider]  sertraline (ZOLOFT) 100 MG tablet Take 100 mg by mouth daily.   Yes [provider]  tiZANidine (ZANAFLEX) 4 MG capsule Take 4 mg by mouth 3 (three) times daily.   Yes [provider]    Physical Exam: Vitals:   12/06/17 0400 12/06/17 0500 12/06/17 0510 12/06/17 0600  BP: 138/67 132/74  137/80  Pulse: 86 96 88 87  Resp: 18 19 17 13   Temp:      TempSrc:      SpO2: 96% 96% 98% 99%  Weight:      Height:         General: Appears calm and comfortable and is NAD; she is super morbidly obese Eyes:  EOMI, normal lids, iris ENT:  grossly normal hearing, lips & tongue, mmm; appropriate dentition Neck:  no LAD, masses or thyromegaly; no carotid bruits Cardiovascular:  RRR, no m/r/g. No LE edema.  Respiratory:   CTA bilaterally with no wheezes/rales/rhonchi.  Normal respiratory effort. Abdomen:  soft, NT, ND, NABS, significant pannus Skin:  RLE erythema and edema, marked compared to right; the calf is quite taut and TTP     Musculoskeletal:  grossly normal tone BUE/BLE, good ROM, no bony abnormality Psychiatric:  grossly normal mood and affect, speech fluent and appropriate, AOx3 Neurologic:  CN 2-12 grossly intact, moves all extremities in coordinated fashion, sensation intact    Radiological Exams on Admission: Dg Chest 2 View  Result Date: 12/05/2017 CLINICAL DATA:  Shortness of breath and dizziness EXAM: CHEST - 2 VIEW COMPARISON:  September 18, 2017 FINDINGS: The heart size and mediastinal contours are within normal limits. Both lungs are clear. The visualized skeletal structures are  unremarkable. IMPRESSION: No active cardiopulmonary disease. Electronically Signed   By: Sherian Rein M.D.   On: 12/05/2017 21:44   Ct Angio Chest Pe W And/or Wo Contrast  Result Date: 12/06/2017 CLINICAL DATA:  47 year old female with shortness of breath. Concern for pulmonary embolism. EXAM: CT ANGIOGRAPHY CHEST WITH CONTRAST TECHNIQUE: Multidetector CT imaging of the chest was performed using the standard protocol  during bolus administration of intravenous contrast. Multiplanar CT image reconstructions and MIPs were obtained to evaluate the vascular anatomy. CONTRAST:  ISOVUE-370 IOPAMIDOL (ISOVUE-370) INJECTION 76% COMPARISON:  Chest radiograph dated 12/05/2017 and CT dated 04/14/2017 FINDINGS: Cardiovascular: There is no cardiomegaly or pericardial effusion. The thoracic aorta is unremarkable. There is a nonocclusive bandlike embolus straddling the bifurcation of the main pulmonary trunk and extending to the central pulmonary arteries consistent with a saddle embolus. There is occlusive thrombus in the lobar branches of the right upper lobe. Nonocclusive clot noted in the lower lobes bilaterally. There is mild enlargement of the right ventricle. The right ventricular diameter is 44.5 mm and the left ventricular diameter is 44 mm with a ratio of approximately 1 consistent with a degree of right heart straining. Mediastinum/Nodes: No hilar or mediastinal adenopathy. The esophagus is grossly unremarkable. There is asymmetric enlargement of the right thyroid lobe. Ultrasound may provide better evaluation on a nonemergent basis. No mediastinal fluid collection. Lungs/Pleura: No focal consolidation, pleural effusion, or pneumothorax. There is a 5 mm right lower lobe nodule (series 9, image 46) similar to prior CT. The central airways are patent. Upper Abdomen: Apparent fatty infiltration of the liver. The visualized upper abdomen is otherwise unremarkable. Musculoskeletal: No chest wall abnormality. No  acute or significant osseous findings. Review of the MIP images confirms the above findings. IMPRESSION: 1. Positive for acute PE with CT evidence of right heart strain (RV/LV Ratio = 1.5) consistent with at least submassive (intermediate risk) PE. The presence of right heart strain has been associated with an increased risk of morbidity and mortality. Please activate Code PE by paging [phone]. 2. Stable 5 mm right lower lobe pulmonary nodule. No follow-up needed if patient is low-risk. Non-contrast chest CT can be considered in 12 months if patient is high-risk. This recommendation follows the consensus statement: Guidelines for Management of Incidental Pulmonary Nodules Detected on CT Images: From the Fleischner Society 2017; Radiology 2017; 284:228-243. These results were called by telephone at the time of interpretation on 12/06/2017 at 2:37 am to Dr. Paula Libra , who verbally acknowledged these results. Electronically Signed   By: Elgie Collard M.D.   On: 12/06/2017 02:40   US Venous Img Lower Bilateral  Result Date: 12/06/2017 CLINICAL DATA:  Bilateral lower extremity pain and swelling left worse than right 4 days. Shortness of breath. EXAM: BILATERAL LOWER EXTREMITY VENOUS DOPPLER ULTRASOUND TECHNIQUE: Gray-scale sonography with graded compression, as well as color Doppler and duplex ultrasound were performed to evaluate the lower extremity deep venous systems from the level of the common femoral vein and including the common femoral, femoral, profunda femoral, popliteal and calf veins including the posterior tibial, peroneal and gastrocnemius veins when visible. The superficial great saphenous vein was also interrogated. Spectral Doppler was utilized to evaluate flow at rest and with distal augmentation maneuvers in the common femoral, femoral and popliteal veins. COMPARISON:  None. FINDINGS: RIGHT LOWER EXTREMITY Common Femoral Vein: No evidence of thrombus. Normal compressibility, respiratory  phasicity and response to augmentation. Saphenofemoral Junction: No evidence of thrombus. Normal compressibility and flow on color Doppler imaging. Profunda Femoral Vein: No evidence of thrombus. Normal compressibility and flow on color Doppler imaging. Femoral Vein: No evidence of thrombus. Normal compressibility, respiratory phasicity and response to augmentation. Popliteal Vein: No evidence of thrombus. Normal compressibility, respiratory phasicity and response to augmentation. Calf Veins: No evidence of thrombus. Normal compressibility and flow on color Doppler imaging. Superficial Great Saphenous Vein: No evidence of thrombus. Normal  compressibility. Venous Reflux:  None. Other Findings:  None. LEFT LOWER EXTREMITY Common Femoral Vein: No evidence of thrombus. Normal compressibility, respiratory phasicity and response to augmentation. Saphenofemoral Junction: No evidence of thrombus. Normal compressibility and flow on color Doppler imaging. Profunda Femoral Vein: No evidence of thrombus. Normal compressibility and flow on color Doppler imaging. Femoral Vein: No evidence of thrombus. Normal compressibility, respiratory phasicity and response to augmentation. Popliteal Vein: Short segment occlusive thrombus which is noncompressible. Calf Veins: No evidence of thrombus. Normal compressibility and flow on color Doppler imaging. Superficial Great Saphenous Vein: No evidence of thrombus. Normal compressibility. Venous Reflux:  None. Other Findings:  Left knee joint effusion. IMPRESSION: Occlusive thrombus left popliteal vein. No deep vein thrombosis within the right lower extremity. Critical Value/emergent results were called by telephone at the time of interpretation on 12/06/2017 at 2:06 am to Dr. Paula Libra , who verbally acknowledged these results. Electronically Signed   By: Elberta Fortis M.D.   On: 12/06/2017 02:06    EKG: Independently reviewed.  NSR with rate 88; LVH; nonspecific ST changes with no  evidence of acute ischemia   Labs on Admission: I have personally reviewed the available labs and imaging studies at the time of the admission.  Pertinent labs:   Glucose 123, BMP otherwise WNL BNP 122.3 Troponin 0.07 Hgb 10.8 D-dimer 14.23  Assessment/Plan Principal Problem:   Acute saddle pulmonary embolism with acute cor pulmonale (HCC) Active Problems:   Pulmonary nodule   Morbid obesity with BMI of 50.0-59.9, adult (HCC)   Essential hypertension   Diabetes mellitus type 2 in obese (HCC)   Acute saddle PE with acute cor pulmonale -Patient without prior episodes of thromboembolic disease presenting with new DVT/PE -This is very likely associated with her obesity and significant immobility -She has at least a submassive PE and so code PE was called and IR has been consulted for possible catheter-directed thrombolysis (I have notified IR via text page that the page has arrived from [facility]) -She is hemodynamically stable at this time and so emergent intervention is not required -Will order Echo -Will admit on telemetry to SDU -Initiate anticoagulation - for now, will start heparin drip -She is likely to need outpatient Lovenox given her size (NOAC therapy is not approved for her BMI), although Coumadin is also an option -The patient understands that thromboembolic disease can be catastrophic and even deadly and that she must be complaint with physician appointments and anticoagulation. -She has been taking daily Provera.  While this is less likely than estrogen to provoke a DVT, it is contraindicated due to her VTE and does place her at increased risk.  She likely needs to discontinue this indefinitely.  Super morbid obesity -She reports that this is the heaviest she has been in her life -She has been out of work since May due to depression issues -I have strongly encouraged her to consider bariatric surgery - not currently approved by her insurance company but it will be starting  in January after a 36-month weight loss clinic enrollment -Her chronic back and knee pain are likely to only worsen so long as her BMI remains so markedly abnormal  DM -Will check A1c -She appears to be diet controlled. -Cover with resistant-scale SSI  HTN -Continue Cardizem and Toprol XL  Pulmonary nodule -Incidental finding -She has a 5-year smoking history and so is at relatively low risk -Screening options include no further follow-up vs. 1-year repeat non-contrast CT  DVT prophylaxis: Heparin drip Code Status:  Full  Family Communication: None present  Disposition Plan:  Home once clinically improved Consults called: IR; CM; PCCM (code PE - telephone/elink only)  Admission status: Admit - It is my clinical opinion that admission to INPATIENT is reasonable and necessary because of the expectation that this patient will require hospital care that crosses at least 2 midnights to treat this condition based on the medical complexity of the problems presented.  Given the aforementioned information, the predictability of an adverse outcome is felt to be significant.     Jonah Blue MD [facility]  If note is complete, please contact covering daytime or nighttime physician. [email or web address] Password [email or web address]  12/06/2017, 9:46 AM

## 2017-12-06 NOTE — Progress Notes (Signed)
  Echocardiogram 2D Echocardiogram has been performed.  Willowdean Luhmann L Androw 12/06/2017, 11:21 AM

## 2017-12-06 NOTE — ED Notes (Signed)
CRITICAL VALUE ALERT  Critical Value: trop 0.07    Provider Notified: [place]

## 2017-12-06 NOTE — Progress Notes (Signed)
Discussed with IR, pt currently at [facility], hemodynamically stable with elevated BP , borderline tachycardia, mild dyspnea. CT shows evidence of RV strain supported by mild elevation of troponin, BNP.  Pt is being transferred over from [facility], waiting for a bed in SDU at [facility]. D/w radiology, pt appears stable at this time, IR will eval after arrival in the AM, if pt deteriorates they can eval earlier.   Wells Guiles, M.D., F.C.C.P.  Board Certified in Internal Medicine, Pulmonary Medicine, Critical Care Medicine, and Sleep Medicine.  [facility] Office Number: [phone]

## 2017-12-06 NOTE — Progress Notes (Signed)
Patient ID: Holly Rodgers, female   DOB: 05/14/1970, 47 y.o.   MRN: [id number]  I have re-reviewed the CTA chest. The RV/LV ration is 1.0. There is no saddle embolus. Only central right pulm embolism and left lobar emboli. There is a slight bump in troponin. Echo is pending. Clinically, she is HD stable with SBP 120, P 80's and pulse ox 95% on [place]. She is in no distress while in bed. There is no strong indication for pulmonary artery lysis at this time. I would recommend anticoagulation unless she becomes compromised.

## 2017-12-06 NOTE — Plan of Care (Signed)
47 yo F with SOB, BLE swelling for past 2-3 days.  DOE.  Saddle PE with R heart strain.  Given first dose of lovenox at 2:10 AM  Will give PCCM a heads up since that's the new protocol for PEs but HR 88 and BP 160/79 so no indication for TPA at this point.

## 2017-12-06 NOTE — Progress Notes (Signed)
ANTICOAGULATION CONSULT NOTE - Initial Consult  Pharmacy Consult for Heparin Indication: pulmonary embolus  Allergies  Allergen Reactions  . Metformin And Related     Patient Measurements: Height: 5\' 11"  (180.3 cm) Weight: (!) 420 lb (190.5 kg) IBW/kg (Calculated) : 70.8 Heparin Dosing Weight: 120 kg  Vital Signs: Temp: 98.7 F (37.1 C) (10/29 2034) Temp Source: Oral (10/29 2034) BP: 132/74 (10/30 0500) Pulse Rate: 88 (10/30 0510)  Labs: Recent Labs    12/06/17 0010  HGB 10.8*  HCT 35.8*  PLT 215  CREATININE 0.87  TROPONINI 0.07*    Estimated Creatinine Clearance: 149.8 mL/min (by C-G formula based on SCr of 0.87 mg/dL).   Medical History: Past Medical History:  Diagnosis Date  . Anxiety   . Chronic pain   . Cyst   . Depression   . Diabetes mellitus without complication (HCC)   . Hearing loss   . Hypertension   . Idiopathic dilatation of pulmonary artery (HCC)   . Tinnitus   . Vertigo     Medications:  No current facility-administered medications on file prior to encounter.    Current Outpatient Medications on File Prior to Encounter  Medication Sig Dispense Refill  . diclofenac (VOLTAREN) 50 MG EC tablet Take 50 mg by mouth 2 (two) times daily.    Marland Kitchen diltiazem (CARDIZEM CD) 300 MG 24 hr capsule Take 300 mg by mouth daily.    Marland Kitchen gabapentin (NEURONTIN) 600 MG tablet Take 600 mg by mouth 3 (three) times daily.    . medroxyPROGESTERone (PROVERA) 10 MG tablet Take 10 mg by mouth daily.    . metoprolol succinate (TOPROL-XL) 50 MG 24 hr tablet Take 50 mg by mouth daily. Take with or immediately following a meal.    . sertraline (ZOLOFT) 100 MG tablet Take 100 mg by mouth daily.    Marland Kitchen tiZANidine (ZANAFLEX) 4 MG capsule Take 4 mg by mouth 3 (three) times daily.       [name] with saddle PE for heparin.  Lovenox 190 mg SQ given at 215  Goal of Therapy:  Heparin level 0.3-0.7 units/ml Monitor platelets by anticoagulation protocol: Yes    Plan:  Start heparin 2000 units/hr at 1000 Check heparin level in 6 hours.   Louanna Vanliew, Gary Fleet 12/06/2017,6:04 AM

## 2017-12-07 DIAGNOSIS — E669 Obesity, unspecified: Secondary | ICD-10-CM

## 2017-12-07 DIAGNOSIS — I82432 Acute embolism and thrombosis of left popliteal vein: Secondary | ICD-10-CM

## 2017-12-07 DIAGNOSIS — E1169 Type 2 diabetes mellitus with other specified complication: Secondary | ICD-10-CM

## 2017-12-07 DIAGNOSIS — Z6841 Body Mass Index (BMI) 40.0 and over, adult: Secondary | ICD-10-CM

## 2017-12-07 DIAGNOSIS — I10 Essential (primary) hypertension: Secondary | ICD-10-CM

## 2017-12-07 LAB — CBC
HEMATOCRIT: 33.6 % — AB (ref 36.0–46.0)
Hemoglobin: 9.6 g/dL — ABNORMAL LOW (ref 12.0–15.0)
MCH: 23.2 pg — AB (ref 26.0–34.0)
MCHC: 28.6 g/dL — ABNORMAL LOW (ref 30.0–36.0)
MCV: 81.4 fL (ref 80.0–100.0)
Platelets: 209 10*3/uL (ref 150–400)
RBC: 4.13 MIL/uL (ref 3.87–5.11)
RDW: 18.6 % — AB (ref 11.5–15.5)
WBC: 8.7 10*3/uL (ref 4.0–10.5)
nRBC: 0 % (ref 0.0–0.2)

## 2017-12-07 LAB — GLUCOSE, CAPILLARY
GLUCOSE-CAPILLARY: 172 mg/dL — AB (ref 70–99)
GLUCOSE-CAPILLARY: 127 mg/dL — AB (ref 70–99)
Glucose-Capillary: 133 mg/dL — ABNORMAL HIGH (ref 70–99)
Glucose-Capillary: 97 mg/dL (ref 70–99)

## 2017-12-07 LAB — BASIC METABOLIC PANEL
Anion gap: 6 (ref 5–15)
BUN: 12 mg/dL (ref 6–20)
CHLORIDE: 103 mmol/L (ref 98–111)
CO2: 26 mmol/L (ref 22–32)
CREATININE: 1.27 mg/dL — AB (ref 0.44–1.00)
Calcium: 8.8 mg/dL — ABNORMAL LOW (ref 8.9–10.3)
GFR calc non Af Amer: 49 mL/min — ABNORMAL LOW (ref >60)
GFR, EST AFRICAN AMERICAN: 57 mL/min — AB (ref >60)
GLUCOSE: 170 mg/dL — AB (ref 70–99)
Potassium: 3.4 mmol/L — ABNORMAL LOW (ref 3.5–5.1)
Sodium: 135 mmol/L (ref 135–145)

## 2017-12-07 LAB — HEPARIN LEVEL (UNFRACTIONATED)
Heparin Unfractionated: 0.24 IU/mL — ABNORMAL LOW (ref 0.30–0.70)
Heparin Unfractionated: 0.3 IU/mL (ref 0.30–0.70)

## 2017-12-07 LAB — HIV ANTIBODY (ROUTINE TESTING W REFLEX): HIV SCREEN 4TH GENERATION: NONREACTIVE

## 2017-12-07 NOTE — Progress Notes (Signed)
Pt with limited mobility, managing to pivot from chair to [facility] with c/o pain when bearing any weight. RN will continue to assist with safe transfers. Pt may need to be seen by PT once activity level is advanced from bathroom privilages. Pt aware that she may be moving on to daily self administered injections. She will need instruction and further education.

## 2017-12-07 NOTE — Progress Notes (Signed)
ANTICOAGULATION CONSULT NOTE  Pharmacy Consult for Heparin Indication: pulmonary embolus  Allergies  Allergen Reactions  . Metformin     Other reaction(s): GI Upset (intolerance)  . Metformin And Related     Patient Measurements: Height: 5\' 11"  (180.3 cm) Weight: (!) 420 lb (190.5 kg) IBW/kg (Calculated) : 70.8 Heparin Dosing Weight: 120 kg  Vital Signs: Temp: 98.4 F (36.9 C) (10/31 0809) Temp Source: Oral (10/31 0809) BP: 138/72 (10/31 0809) Pulse Rate: 84 (10/31 0809)  Labs: Recent Labs    12/06/17 0010 12/06/17 0627 12/06/17 1035  12/06/17 1524 12/06/17 2109 12/07/17 0305 12/07/17 1011  HGB 10.8*  --   --   --   --   --  9.6*  --   HCT 35.8*  --   --   --   --   --  33.6*  --   PLT 215  --   --   --   --   --  209  --   HEPARINUNFRC  --   --   --    < > 0.58 0.27* 0.24* 0.30  HEPRLOWMOCWT  --  1.11  --   --   --   --   --   --   CREATININE 0.87  --   --   --   --   --  1.27*  --   TROPONINI 0.07*  --  <0.03  --  <0.03 0.03*  --   --    < > = values in this interval not displayed.    Estimated Creatinine Clearance: 102.6 mL/min (A) (by C-G formula based on SCr of 1.27 mg/dL (H)).   [name] with to continue on IV heparin for saddle PE.   Heparin level is therapeutic at 0.3, on 1500 units/hr. Hgb 9.6, plt 209. No s/sx of bleeding. No infusion issues documented  Goal of Therapy:  Heparin level 0.3-0.7 units/ml Monitor platelets by anticoagulation protocol: Yes   Plan:  Increase heparin gtt to 1600 units/hr Heparin level and CBC daily Monitor for bleeding complications  Thanks for allowing pharmacy to be a part of this patient's care.  Girard Cooter, PharmD Clinical Pharmacist  Pager: [phone] Phone: [phone] 12/07/2017, 10:56 AM

## 2017-12-07 NOTE — Progress Notes (Signed)
PROGRESS NOTE    NAYELIE GIONFRIDDO  ZOX:[id number] DOB: 1970/12/01 DOA: 12/05/2017 PCP: Kyung Rudd, MD  Outpatient Specialists:   Brief Narrative:  Holly Rodgers is a 47 year old African-American female, morbidly obese, with past medical history significant for hypertension, diabetes mellitus, depression, anxiety and chronic pain.  Patient presented with shortness of breath, dyspnea on minimal exertion, bilateral lower extremity pain and swelling, worse on the left side.  CT Angie of the chest done on presentation was "Positive for acute PE with CT evidence of right heart strain (RV/LV Ratio = 1.5) consistent with at least submassive (intermediate risk) PE. The presence of right heart strain has been associated with an increased risk of morbidity and mortality.  Stable 5 mm right lower lobe pulmonary nodule".  Vascular ultrasound of the lower extremities revealed "Occlusive thrombus left popliteal vein".  Patient is currently on heparin drip.  Patient's vital signs are stable.  Patient is on room air.  According to the patient, due to pain, patient was minimally active at home   Assessment & Plan:   Principal Problem:   Acute saddle pulmonary embolism with acute cor pulmonale (HCC) Active Problems:   Pulmonary nodule   Morbid obesity with BMI of 50.0-59.9, adult (HCC)   Essential hypertension   Diabetes mellitus type 2 in obese (HCC)   Acute saddle PE with acute cor pulmonale -Patient without prior episodes of thromboembolic disease presenting with new DVT/PE -This is very likely associated with her obesity and significant immobility -She has at least a submassive PE and so code PE was called  - IR saw patient but no catheter directed thrombolysis planned for now (Vitals are stable and patient is on room air)a -ECHO is negative for right heart strain. -Continue Heparin drip for now. -Patient will discuss safety of Provera with PCP.  Super morbid obesity - Diet and  exercise -Needs to loose weight.  DM -Follow HbA1c. -Continue SSI Coverage.  HTN -Continue Cardizem and Toprol XL Continue to optimize  Pulmonary nodule -Incidental finding -She has a 5-year smoking history and so is at relatively low risk -Screening options include no further follow-up vs. 1-year repeat non-contrast CT  DVT prophylaxis: Heparin drip Code Status:  Full  Family Communication:   Disposition Plan:  Home once clinically improved Consults called: IR; CM; PCCM (code PE - telephone/elink only)    Procedures:   None  Antimicrobials:   None    Subjective: Reports some improvement SOB has improved Continues to report pain in lower extremities  Objective: Vitals:   12/06/17 2346 12/07/17 0401 12/07/17 0809 12/07/17 1151  BP: 103/65 101/66 138/72 (!) 143/91  Pulse: 81 77 84 79  Resp: 18 18 20 18   Temp: 98.7 F (37.1 C) (!) 97.5 F (36.4 C) 98.4 F (36.9 C) 98.4 F (36.9 C)  TempSrc: Oral Oral Oral Oral  SpO2: 96% 92% 99% 98%  Weight:      Height:        Intake/Output Summary (Last 24 hours) at 12/07/2017 1343 Last data filed at 12/07/2017 1200 Gross per 24 hour  Intake 2369.54 ml  Output 300 ml  Net 2069.54 ml   Filed Weights   12/05/17 2025  Weight: (!) 190.5 kg    Examination:  General exam: Morbidly obese.  Appears calm and comfortable  Respiratory system: Clear to auscultation. Respiratory effort normal. Cardiovascular system: S1 & S2 heard. Gastrointestinal system: Abdomen is morbidly obese, soft and nontender. Organs are difficult to assess. Central nervous system: Alert and  oriented. No focal neurological deficits. Extremities: Bilateral lower extremity swelling worse on the left side  Data Reviewed: I have personally reviewed following labs and imaging studies  CBC: Recent Labs  Lab 12/06/17 0010 12/07/17 0305  WBC 10.2 8.7  NEUTROABS 7.6  --   HGB 10.8* 9.6*  HCT 35.8* 33.6*  MCV 79.2* 81.4  PLT 215 209   Basic  Metabolic Panel: Recent Labs  Lab 12/06/17 0010 12/07/17 0305  NA 136 135  K 3.6 3.4*  CL 101 103  CO2 25 26  GLUCOSE 123* 170*  BUN 14 12  CREATININE 0.87 1.27*  CALCIUM 9.1 8.8*   GFR: Estimated Creatinine Clearance: 102.6 mL/min (A) (by C-G formula based on SCr of 1.27 mg/dL (H)). Liver Function Tests: No results for input(s): AST, ALT, ALKPHOS, BILITOT, PROT, ALBUMIN in the last 168 hours. No results for input(s): LIPASE, AMYLASE in the last 168 hours. No results for input(s): AMMONIA in the last 168 hours. Coagulation Profile: No results for input(s): INR, PROTIME in the last 168 hours. Cardiac Enzymes: Recent Labs  Lab 12/06/17 0010 12/06/17 1035 12/06/17 1524 12/06/17 2109  TROPONINI 0.07* <0.03 <0.03 0.03*   BNP (last 3 results) No results for input(s): PROBNP in the last 8760 hours. HbA1C: Recent Labs    12/06/17 1035  HGBA1C 7.4*   CBG: Recent Labs  Lab 12/06/17 1235 12/06/17 1647 12/06/17 2039 12/07/17 0808 12/07/17 1154  GLUCAP 96 144* 115* 172* 133*   Lipid Profile: No results for input(s): CHOL, HDL, LDLCALC, TRIG, CHOLHDL, LDLDIRECT in the last 72 hours. Thyroid Function Tests: No results for input(s): TSH, T4TOTAL, FREET4, T3FREE, THYROIDAB in the last 72 hours. Anemia Panel: No results for input(s): VITAMINB12, FOLATE, FERRITIN, TIBC, IRON, RETICCTPCT in the last 72 hours. Urine analysis: No results found for: COLORURINE, APPEARANCEUR, LABSPEC, PHURINE, GLUCOSEU, HGBUR, BILIRUBINUR, KETONESUR, PROTEINUR, UROBILINOGEN, NITRITE, LEUKOCYTESUR Sepsis Labs: @LABRCNTIP (procalcitonin:4,lacticidven:4)  )No results found for this or any previous visit (from the past 240 hour(s)).       Radiology Studies: Dg Chest 2 View  Result Date: 12/05/2017 CLINICAL DATA:  Shortness of breath and dizziness EXAM: CHEST - 2 VIEW COMPARISON:  September 18, 2017 FINDINGS: The heart size and mediastinal contours are within normal limits. Both lungs are clear.  The visualized skeletal structures are unremarkable. IMPRESSION: No active cardiopulmonary disease. Electronically Signed   By: Sherian Rein M.D.   On: 12/05/2017 21:44   Ct Angio Chest Pe W And/or Wo Contrast  Result Date: 12/06/2017 CLINICAL DATA:  47 year old female with shortness of breath. Concern for pulmonary embolism. EXAM: CT ANGIOGRAPHY CHEST WITH CONTRAST TECHNIQUE: Multidetector CT imaging of the chest was performed using the standard protocol during bolus administration of intravenous contrast. Multiplanar CT image reconstructions and MIPs were obtained to evaluate the vascular anatomy. CONTRAST:  ISOVUE-370 IOPAMIDOL (ISOVUE-370) INJECTION 76% COMPARISON:  Chest radiograph dated 12/05/2017 and CT dated 04/14/2017 FINDINGS: Cardiovascular: There is no cardiomegaly or pericardial effusion. The thoracic aorta is unremarkable. There is a nonocclusive bandlike embolus straddling the bifurcation of the main pulmonary trunk and extending to the central pulmonary arteries consistent with a saddle embolus. There is occlusive thrombus in the lobar branches of the right upper lobe. Nonocclusive clot noted in the lower lobes bilaterally. There is mild enlargement of the right ventricle. The right ventricular diameter is 44.5 mm and the left ventricular diameter is 44 mm with a ratio of approximately 1 consistent with a degree of right heart straining. Mediastinum/Nodes: No hilar or  mediastinal adenopathy. The esophagus is grossly unremarkable. There is asymmetric enlargement of the right thyroid lobe. Ultrasound may provide better evaluation on a nonemergent basis. No mediastinal fluid collection. Lungs/Pleura: No focal consolidation, pleural effusion, or pneumothorax. There is a 5 mm right lower lobe nodule (series 9, image 46) similar to prior CT. The central airways are patent. Upper Abdomen: Apparent fatty infiltration of the liver. The visualized upper abdomen is otherwise unremarkable.  Musculoskeletal: No chest wall abnormality. No acute or significant osseous findings. Review of the MIP images confirms the above findings. IMPRESSION: 1. Positive for acute PE with CT evidence of right heart strain (RV/LV Ratio = 1.5) consistent with at least submassive (intermediate risk) PE. The presence of right heart strain has been associated with an increased risk of morbidity and mortality. Please activate Code PE by paging [phone]. 2. Stable 5 mm right lower lobe pulmonary nodule. No follow-up needed if patient is low-risk. Non-contrast chest CT can be considered in 12 months if patient is high-risk. This recommendation follows the consensus statement: Guidelines for Management of Incidental Pulmonary Nodules Detected on CT Images: From the Fleischner Society 2017; Radiology 2017; 284:228-243. These results were called by telephone at the time of interpretation on 12/06/2017 at 2:37 am to Dr. Paula Libra , who verbally acknowledged these results. Electronically Signed   By: Elgie Collard M.D.   On: 12/06/2017 02:40   US Venous Img Lower Bilateral  Result Date: 12/06/2017 CLINICAL DATA:  Bilateral lower extremity pain and swelling left worse than right 4 days. Shortness of breath. EXAM: BILATERAL LOWER EXTREMITY VENOUS DOPPLER ULTRASOUND TECHNIQUE: Gray-scale sonography with graded compression, as well as color Doppler and duplex ultrasound were performed to evaluate the lower extremity deep venous systems from the level of the common femoral vein and including the common femoral, femoral, profunda femoral, popliteal and calf veins including the posterior tibial, peroneal and gastrocnemius veins when visible. The superficial great saphenous vein was also interrogated. Spectral Doppler was utilized to evaluate flow at rest and with distal augmentation maneuvers in the common femoral, femoral and popliteal veins. COMPARISON:  None. FINDINGS: RIGHT LOWER EXTREMITY Common Femoral Vein: No evidence  of thrombus. Normal compressibility, respiratory phasicity and response to augmentation. Saphenofemoral Junction: No evidence of thrombus. Normal compressibility and flow on color Doppler imaging. Profunda Femoral Vein: No evidence of thrombus. Normal compressibility and flow on color Doppler imaging. Femoral Vein: No evidence of thrombus. Normal compressibility, respiratory phasicity and response to augmentation. Popliteal Vein: No evidence of thrombus. Normal compressibility, respiratory phasicity and response to augmentation. Calf Veins: No evidence of thrombus. Normal compressibility and flow on color Doppler imaging. Superficial Great Saphenous Vein: No evidence of thrombus. Normal compressibility. Venous Reflux:  None. Other Findings:  None. LEFT LOWER EXTREMITY Common Femoral Vein: No evidence of thrombus. Normal compressibility, respiratory phasicity and response to augmentation. Saphenofemoral Junction: No evidence of thrombus. Normal compressibility and flow on color Doppler imaging. Profunda Femoral Vein: No evidence of thrombus. Normal compressibility and flow on color Doppler imaging. Femoral Vein: No evidence of thrombus. Normal compressibility, respiratory phasicity and response to augmentation. Popliteal Vein: Short segment occlusive thrombus which is noncompressible. Calf Veins: No evidence of thrombus. Normal compressibility and flow on color Doppler imaging. Superficial Great Saphenous Vein: No evidence of thrombus. Normal compressibility. Venous Reflux:  None. Other Findings:  Left knee joint effusion. IMPRESSION: Occlusive thrombus left popliteal vein. No deep vein thrombosis within the right lower extremity. Critical Value/emergent results were called by telephone at the time  of interpretation on 12/06/2017 at 2:06 am to Dr. Paula Libra , who verbally acknowledged these results. Electronically Signed   By: Elberta Fortis M.D.   On: 12/06/2017 02:06        Scheduled Meds: . diltiazem  300  mg Oral Daily  . docusate sodium  100 mg Oral BID  . gabapentin  600 mg Oral TID  . Influenza vac split quadrivalent PF  0.5 mL Intramuscular Tomorrow-1000  . insulin aspart  0-20 Units Subcutaneous TID WC  . metoprolol succinate  50 mg Oral Daily  . pneumococcal 23 valent vaccine  0.5 mL Intramuscular Tomorrow-1000  . sertraline  100 mg Oral Daily  . sodium chloride flush  3 mL Intravenous Q12H   Continuous Infusions: . heparin 1,600 Units/hr (12/07/17 1200)  . lactated ringers 10 mL/hr at 12/07/17 0800     LOS: 1 day    Time spent: 45 Minutes    Berton Mount, MD  [facility] Pager #: [phone] 7PM-7AM contact night coverage as above

## 2017-12-08 LAB — BASIC METABOLIC PANEL
ANION GAP: 6 (ref 5–15)
BUN: 12 mg/dL (ref 6–20)
CALCIUM: 9.1 mg/dL (ref 8.9–10.3)
CO2: 26 mmol/L (ref 22–32)
CREATININE: 0.9 mg/dL (ref 0.44–1.00)
Chloride: 102 mmol/L (ref 98–111)
GFR calc Af Amer: >60 mL/min (ref >60)
GLUCOSE: 112 mg/dL — AB (ref 70–99)
Potassium: 3.6 mmol/L (ref 3.5–5.1)
Sodium: 134 mmol/L — ABNORMAL LOW (ref 135–145)

## 2017-12-08 LAB — GLUCOSE, CAPILLARY
GLUCOSE-CAPILLARY: 110 mg/dL — AB (ref 70–99)
Glucose-Capillary: 116 mg/dL — ABNORMAL HIGH (ref 70–99)
Glucose-Capillary: 109 mg/dL — ABNORMAL HIGH (ref 70–99)
Glucose-Capillary: 157 mg/dL — ABNORMAL HIGH (ref 70–99)

## 2017-12-08 LAB — CBC
HCT: 32.8 % — ABNORMAL LOW (ref 36.0–46.0)
Hemoglobin: 9.3 g/dL — ABNORMAL LOW (ref 12.0–15.0)
MCH: 23.1 pg — ABNORMAL LOW (ref 26.0–34.0)
MCHC: 28.4 g/dL — ABNORMAL LOW (ref 30.0–36.0)
MCV: 81.4 fL (ref 80.0–100.0)
PLATELETS: 186 10*3/uL (ref 150–400)
RBC: 4.03 MIL/uL (ref 3.87–5.11)
RDW: 18.5 % — AB (ref 11.5–15.5)
WBC: 8.3 10*3/uL (ref 4.0–10.5)
nRBC: 0.2 % (ref 0.0–0.2)

## 2017-12-08 LAB — PROTIME-INR
INR: 1
Prothrombin Time: 13.1 seconds (ref 11.4–15.2)

## 2017-12-08 LAB — MAGNESIUM: Magnesium: 2 mg/dL (ref 1.7–2.4)

## 2017-12-08 LAB — HEPARIN LEVEL (UNFRACTIONATED): HEPARIN UNFRACTIONATED: 0.14 [IU]/mL — AB (ref 0.30–0.70)

## 2017-12-08 MED ORDER — ACETAMINOPHEN 500 MG PO TABS: 500.0000 mg | ORAL_TABLET | Freq: Three times a day (TID) | ORAL | 0 refills | Status: DC | PRN

## 2017-12-08 MED ORDER — WARFARIN VIDEO: Freq: Once | Status: DC

## 2017-12-08 MED ORDER — COUMADIN BOOK
Freq: Once | Status: AC
  Administered 2017-12-08: 10:00:00
  Filled 2017-12-08: qty 1

## 2017-12-08 MED ORDER — WARFARIN - PHARMACIST DOSING INPATIENT
Freq: Every day | Status: DC
  Administered 2017-12-08: 17:00:00
  Administered 2017-12-09 – 2017-12-10 (×2): 1
  Administered 2017-12-11: 18:00:00

## 2017-12-08 MED ORDER — APIXABAN 5 MG PO TABS: 10.0000 mg | ORAL_TABLET | Freq: Two times a day (BID) | ORAL | Status: DC

## 2017-12-08 MED ORDER — ENOXAPARIN SODIUM 300 MG/3ML IJ SOLN: 190.0000 mg | Freq: Two times a day (BID) | INTRAMUSCULAR | 0 refills | Status: DC

## 2017-12-08 MED ORDER — WARFARIN SODIUM 10 MG PO TABS: 5.0000 mg | ORAL_TABLET | Freq: Every day | ORAL | 0 refills | Status: DC

## 2017-12-08 MED ORDER — ENOXAPARIN SODIUM 300 MG/3ML IJ SOLN
1.0000 mg/kg | Freq: Two times a day (BID) | INTRAMUSCULAR | Status: DC
  Administered 2017-12-08 – 2017-12-09 (×3): 191 mg via SUBCUTANEOUS
  Filled 2017-12-08 (×4): qty 1.91

## 2017-12-08 MED ORDER — HEPARIN BOLUS VIA INFUSION
3000.0000 [IU] | Freq: Once | INTRAVENOUS | Status: AC
  Administered 2017-12-08: 3000 [IU] via INTRAVENOUS
  Filled 2017-12-08: qty 3000

## 2017-12-08 MED ORDER — APIXABAN 5 MG PO TABS: 5.0000 mg | ORAL_TABLET | Freq: Two times a day (BID) | ORAL | Status: DC

## 2017-12-08 MED ORDER — POTASSIUM CHLORIDE CRYS ER 20 MEQ PO TBCR
40.0000 meq | EXTENDED_RELEASE_TABLET | Freq: Once | ORAL | Status: AC
  Administered 2017-12-08: 40 meq via ORAL
  Filled 2017-12-08: qty 2

## 2017-12-08 MED ORDER — TIZANIDINE HCL 4 MG PO TABS
4.0000 mg | ORAL_TABLET | Freq: Two times a day (BID) | ORAL | Status: DC | PRN
  Administered 2017-12-08 – 2017-12-11 (×6): 8 mg via ORAL
  Filled 2017-12-08 (×6): qty 2

## 2017-12-08 MED ORDER — HYDROMORPHONE HCL 1 MG/ML IJ SOLN
1.0000 mg | INTRAMUSCULAR | Status: AC | PRN
  Administered 2017-12-08 – 2017-12-10 (×4): 1 mg via INTRAVENOUS
  Filled 2017-12-08 (×4): qty 1

## 2017-12-08 MED ORDER — COUMADIN BOOK: 1.0000 | Freq: Once | 0 refills | Status: DC

## 2017-12-08 MED ORDER — WARFARIN SODIUM 10 MG PO TABS
10.0000 mg | ORAL_TABLET | Freq: Once | ORAL | Status: AC
  Administered 2017-12-08: 10 mg via ORAL
  Filled 2017-12-08: qty 1

## 2017-12-08 NOTE — Progress Notes (Signed)
Unable to check  o2 sat on ambulation, with difficulty standing and walking  Due to pain on both legs.

## 2017-12-08 NOTE — Progress Notes (Addendum)
ANTICOAGULATION CONSULT NOTE - Follow Up Consult  Pharmacy Consult for heparin >> apixaban Indication: pulmonary embolus  Labs: Recent Labs    12/06/17 0010 12/06/17 0627 12/06/17 1035  12/06/17 1524 12/06/17 2109 12/07/17 0305 12/07/17 1011 12/08/17 0220  HGB 10.8*  --   --   --   --   --  9.6*  --  9.3*  HCT 35.8*  --   --   --   --   --  33.6*  --  32.8*  PLT 215  --   --   --   --   --  209  --  186  HEPARINUNFRC  --   --   --    < > 0.58 0.27* 0.24* 0.30 0.14*  HEPRLOWMOCWT  --  1.11  --   --   --   --   --   --   --   CREATININE 0.87  --   --   --   --   --  1.27*  --   --   TROPONINI 0.07*  --  <0.03  --  <0.03 0.03*  --   --   --    < > = values in this interval not displayed.    Assessment: 47yo female now subtherapeutic on heparin w/ considerable drop in level, no gtt issues or signs of bleeding per RN.  Goal of Therapy:  Heparin level 0.3-0.7 units/ml   Plan:  Will rebolus with heparin 3000 units and increase heparin gtt by 3-4 units/kgABW/hr to 2000 units/hr and check level in 6 hours.    Vernard Gambles, PharmD, BCPS  12/08/2017,4:38 AM   Addendum: Now to transition to Eliquis.  Will d/c heparin gtt at 10a and start Eliquis 10mg  BID x10d followed by 5mg  BID.  Will start PO anticoagulation education.  VB 6:37 AM

## 2017-12-08 NOTE — NC FL2 (Signed)
[place] MEDICAID FL2 LEVEL OF CARE SCREENING TOOL     IDENTIFICATION  Patient Name: Holly Rodgers Birthdate: 05-Nov-1970 Sex: female Admission Date (Current Location): 12/05/2017  [facility] and [place] Number:  Producer, television/film/video and Address:  [facility], [address] [place], [place] [address]      Provider Number: [id number]  Attending Physician Name and Address:  Leroy Sea, MD  Relative Name and Phone Number:       Current Level of Care: Hospital Recommended Level of Care: Skilled Nursing Facility Prior Approval Number:    Date Approved/Denied:   PASRR Number: [id number]  Discharge Plan: SNF    Current Diagnoses: Patient Active Problem List   Diagnosis Date Noted  . Acute saddle pulmonary embolism with acute cor pulmonale (HCC) 12/06/2017  . Pulmonary nodule 12/06/2017  . Morbid obesity with BMI of 50.0-59.9, adult (HCC) 12/06/2017  . Essential hypertension 12/06/2017  . Diabetes mellitus type 2 in obese (HCC) 12/06/2017  . Right thyroid nodule 08/19/2010    Orientation RESPIRATION BLADDER Height & Weight     Self, Time, Situation, Place  Normal Continent Weight: (!) 420 lb (190.5 kg) Height:  5\' 11"  (180.3 cm)  BEHAVIORAL SYMPTOMS/MOOD NEUROLOGICAL BOWEL NUTRITION STATUS  (None) (None) Continent Diet(Regular)  AMBULATORY STATUS COMMUNICATION OF NEEDS Skin   Limited Assist Verbally Other (Comment)(MASD.)                       Personal Care Assistance Level of Assistance  Bathing, Feeding, Dressing Bathing Assistance: Limited assistance Feeding assistance: Independent Dressing Assistance: Limited assistance     Functional Limitations Info  Sight, Hearing, Speech Sight Info: Adequate Hearing Info: Adequate Speech Info: Adequate    SPECIAL CARE FACTORS FREQUENCY  PT (By licensed PT), Blood pressure     PT Frequency: 5 x week              Contractures Contractures Info: Not present     Additional Factors Info  Code Status, Allergies Code Status Info: Full code Allergies Info: Metformin, Metformin and related.           Current Medications (12/08/2017):  This is the current hospital active medication list Current Facility-Administered Medications  Medication Dose Route Frequency Provider Last Rate Last Dose  . acetaminophen (TYLENOL) tablet 650 mg  650 mg Oral Q6H PRN Jonah Blue, MD   650 mg at 12/06/17 1254   Or  . acetaminophen (TYLENOL) suppository 650 mg  650 mg Rectal Q6H PRN Jonah Blue, MD      . diltiazem (CARDIZEM CD) 24 hr capsule 300 mg  300 mg Oral Daily Jonah Blue, MD   300 mg at 12/08/17 0958  . docusate sodium (COLACE) capsule 100 mg  100 mg Oral BID Jonah Blue, MD   100 mg at 12/08/17 0958  . enoxaparin (LOVENOX) injection 191 mg  1 mg/kg Subcutaneous Q12H Leroy Sea, MD   191 mg at 12/08/17 1002  . gabapentin (NEURONTIN) tablet 600 mg  600 mg Oral TID Jonah Blue, MD   600 mg at 12/08/17 0959  . Influenza vac split quadrivalent PF (FLUARIX) injection 0.5 mL  0.5 mL Intramuscular Tomorrow-1000 Jonah Blue, MD      . insulin aspart (novoLOG) injection 0-20 Units  0-20 Units Subcutaneous TID WC Jonah Blue, MD   3 Units at 12/07/17 2150  . metoprolol succinate (TOPROL-XL) 24 hr tablet 50 mg  50 mg Oral Daily Jonah Blue,  MD   50 mg at 12/08/17 0958  . morphine 2 MG/ML injection 2 mg  2 mg Intravenous Q2H PRN Jonah Blue, MD   2 mg at 12/08/17 1008  . ondansetron (ZOFRAN) tablet 4 mg  4 mg Oral Q6H PRN Jonah Blue, MD   4 mg at 12/07/17 2037   Or  . ondansetron [facility]) injection 4 mg  4 mg Intravenous Q6H PRN Jonah Blue, MD   4 mg at 12/06/17 2213  . pneumococcal 23 valent vaccine (PNU-IMMUNE) injection 0.5 mL  0.5 mL Intramuscular Tomorrow-1000 Jonah Blue, MD      . sertraline (ZOLOFT) tablet 100 mg  100 mg Oral Daily Jonah Blue, MD   100 mg at 12/08/17 1000  . sodium chloride flush (NS)  0.9 % injection 3 mL  3 mL Intravenous Q12H Jonah Blue, MD   3 mL at 12/08/17 1000  . tiZANidine (ZANAFLEX) tablet 4 mg  4 mg Oral BID PRN Jonah Blue, MD   4 mg at 12/07/17 2200  . warfarin (COUMADIN) tablet 10 mg  10 mg Oral ONCE-1800 Leroy Sea, MD      . warfarin (COUMADIN) video   Does not apply Once Leroy Sea, MD      . Warfarin - Pharmacist Dosing Inpatient   Does not apply [id number] Leroy Sea, MD         Discharge Medications: Please see discharge summary for a list of discharge medications.  Relevant Imaging Results:  Relevant Lab Results:   Additional Information SS#: [id number]  Margarito Liner, LCSW

## 2017-12-08 NOTE — Progress Notes (Signed)
Assisted to the bedside commode x2 assist, with difficulty standing due to pain on both legs.

## 2017-12-08 NOTE — Progress Notes (Signed)
Initiated lovenox injection teaching to abdominal site, perceptive to education. To continue to do the teaching.

## 2017-12-08 NOTE — Clinical Social Work Note (Addendum)
Per RNCM, first preference SNF is [facility]. They will review referral and notify CSW of decision.  Charlynn Court, CSW [phone]  2:01 pm [facility] can take patient once insurance authorization is obtained.  Charlynn Court, CSW [phone]  4:32 pm Per SNF admissions coordinator, may not have authorization until Monday. RNCM took admissions paperwork to patient for her to fill out. Filled out fax cover sheet so it can be faxed back as soon as possible.  Charlynn Court, CSW [phone]

## 2017-12-08 NOTE — Progress Notes (Signed)
ANTICOAGULATION CONSULT NOTE - Follow Up Consult  Pharmacy Consult for heparin >> enox + warfarin Indication: pulmonary embolus  Labs: Recent Labs    12/06/17 0010 12/06/17 0627 12/06/17 1035  12/06/17 1524 12/06/17 2109 12/07/17 0305 12/07/17 1011 12/08/17 0220  HGB 10.8*  --   --   --   --   --  9.6*  --  9.3*  HCT 35.8*  --   --   --   --   --  33.6*  --  32.8*  PLT 215  --   --   --   --   --  209  --  186  HEPARINUNFRC  --   --   --    < > 0.58 0.27* 0.24* 0.30 0.14*  HEPRLOWMOCWT  --  1.11  --   --   --   --   --   --   --   CREATININE 0.87  --   --   --   --   --  1.27*  --   --   TROPONINI 0.07*  --  <0.03  --  <0.03 0.03*  --   --   --    < > = values in this interval not displayed.    Assessment: 47yo female on heparin infusion for saddle PE.  Given weight>120 kg (wt documented today at 190 kg), will start enoxaparin bridge with warfarin. The ISTH 2016 guidelines suggest avoiding using apixaban due to lack of clinical data in obese populations. Hgb 9.3, plt 186. No s/sx of bleeding.   Goal of Therapy:  Enoxaparin reference range: 0.6 to 1 units/mL INR goal: 2-3   Plan:  Discontinue heparin infusion  Start enoxaparin 1 mg/kg every 12 hours starting 1 hour after heparin infusion discontinuation Start warfarin 10 mg once tonight Monitor daily INR, CBC, and for medication interactions Recommend enoxaparin level drawn 4-6 hours after 3rd or 4th dose  Girard Cooter, PharmD Clinical Pharmacist  Pager: [phone] Phone: [phone]

## 2017-12-08 NOTE — Progress Notes (Signed)
MD discontinued d/c home order today.

## 2017-12-08 NOTE — Discharge Instructions (Signed)
Follow with Primary MD Kyung Rudd, MD in 3 days   Get CBC, CMP, INR  -  checked  by Primary MD in 3 days    Activity: As tolerated with Full fall precautions use walker/cane & assistance as needed  Disposition Home    Diet: Heart Healthy  Low Carb   Special Instructions: If you have smoked or chewed Tobacco  in the last 2 yrs please stop smoking, stop any regular Alcohol  and or any Recreational drug use.  On your next visit with your primary care physician please Get Medicines reviewed and adjusted.  Please request your Prim.MD to go over all Hospital Tests and Procedure/Radiological results at the follow up, please get all Hospital records sent to your Prim MD by signing hospital release before you go home.  If you experience worsening of your admission symptoms, develop shortness of breath, life threatening emergency, suicidal or homicidal thoughts you must seek medical attention immediately by calling 911 or calling your MD immediately  if symptoms less severe.  You Must read complete instructions/literature along with all the possible adverse reactions/side effects for all the Medicines you take and that have been prescribed to you. Take any new Medicines after you have completely understood and accpet all the possible adverse reactions/side effects.   - - - - - - - - - - - - - - - - - - - - - - - - - - - - - - - - - - - - - - - - - - - - - - - - - - - - - - - - - - - - - - - - - - - - - - - - - - - - - - - - - - - - - - - - - - - - - - - - - - -  Information on my medicine - Coumadin   (Warfarin)  Why was Coumadin prescribed for you? Coumadin was prescribed for you because you have a blood clot or a medical condition that can cause an increased risk of forming blood clots. Blood clots can cause serious health problems by blocking the flow of blood to the heart, lung, or brain. Coumadin can prevent harmful blood clots from forming. As a reminder your indication for Coumadin is:    Pulmonary Embolism Treatment  What test will check on my response to Coumadin? While on Coumadin (warfarin) you will need to have an INR test regularly to ensure that your dose is keeping you in the desired range. The INR (international normalized ratio) number is calculated from the result of the laboratory test called prothrombin time (PT).  If an INR APPOINTMENT HAS NOT ALREADY BEEN MADE FOR YOU please schedule an appointment to have this lab work done by your health care provider within 7 days. Your INR goal is usually a number between:  2 to 3 or your provider may give you a more narrow range like 2-2.5.  Ask your health care provider during an office visit what your goal INR is.  What  do you need to  know  About  COUMADIN? Take Coumadin (warfarin) exactly as prescribed by your healthcare provider about the same time each day.  DO NOT stop taking without talking to the doctor who prescribed the medication.  Stopping without other blood clot prevention medication to take the place of Coumadin may increase your risk of developing a new clot or stroke.  Get refills before  you run out.  What do you do if you miss a dose? If you miss a dose, take it as soon as you remember on the same day then continue your regularly scheduled regimen the next day.  Do not take two doses of Coumadin at the same time.  Important Safety Information A possible side effect of Coumadin (Warfarin) is an increased risk of bleeding. You should call your healthcare provider right away if you experience any of the following: ? Bleeding from an injury or your nose that does not stop. ? Unusual colored urine (red or dark brown) or unusual colored stools (red or black). ? Unusual bruising for unknown reasons. ? A serious fall or if you hit your head (even if there is no bleeding).  Some foods or medicines interact with Coumadin (warfarin) and might alter your response to warfarin. To help avoid this: ? Eat a balanced  diet, maintaining a consistent amount of Vitamin K. ? Notify your provider about major diet changes you plan to make. ? Avoid alcohol or limit your intake to 1 drink for women and 2 drinks for men per day. (1 drink is 5 oz. wine, 12 oz. beer, or 1.5 oz. liquor.)  Make sure that ANY health care provider who prescribes medication for you knows that you are taking Coumadin (warfarin).  Also make sure the healthcare provider who is monitoring your Coumadin knows when you have started a new medication including herbals and non-prescription products.  Coumadin (Warfarin)  Major Drug Interactions  Increased Warfarin Effect Decreased Warfarin Effect  Alcohol (large quantities) Antibiotics (esp. Septra/Bactrim, Flagyl, Cipro) Amiodarone (Cordarone) Aspirin (ASA) Cimetidine (Tagamet) Megestrol (Megace) NSAIDs (ibuprofen, naproxen, etc.) Piroxicam (Feldene) Propafenone (Rythmol SR) Propranolol (Inderal) Isoniazid (INH) Posaconazole (Noxafil) Barbiturates (Phenobarbital) Carbamazepine (Tegretol) Chlordiazepoxide (Librium) Cholestyramine (Questran) Griseofulvin Oral Contraceptives Rifampin Sucralfate (Carafate) Vitamin K   Coumadin (Warfarin) Major Herbal Interactions  Increased Warfarin Effect Decreased Warfarin Effect  Garlic Ginseng Ginkgo biloba Coenzyme Q10 Green tea St. Johns wort    Coumadin (Warfarin) FOOD Interactions  Eat a consistent number of servings per week of foods HIGH in Vitamin K (1 serving =  cup)  Collards (cooked, or boiled & drained) Kale (cooked, or boiled & drained) Mustard greens (cooked, or boiled & drained) Parsley *serving size only =  cup Spinach (cooked, or boiled & drained) Swiss chard (cooked, or boiled & drained) Turnip greens (cooked, or boiled & drained)  Eat a consistent number of servings per week of foods MEDIUM-HIGH in Vitamin K (1 serving = 1 cup)  Asparagus (cooked, or boiled & drained) Broccoli (cooked, boiled & drained, or raw &  chopped) Brussel sprouts (cooked, or boiled & drained) *serving size only =  cup Lettuce, raw (green leaf, endive, romaine) Spinach, raw Turnip greens, raw & chopped   These websites have more information on Coumadin (warfarin):  http://www.king-russell.com/; https://www.hines.net/;

## 2017-12-08 NOTE — Evaluation (Signed)
Physical Therapy Evaluation Patient Details Name: Holly Rodgers MRN: [id number] DOB: 1970-05-30 Today's Date: 12/08/2017   History of Present Illness  Pt is a 47 y/o female admitted for progressive SOB. 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 PMH includes obesity, HTN, and DM.   Clinical Impression  Pt admitted secondary to problem above with deficits below. Pt with difficulty ambulating this session secondary to pain in BLE. Heavy reliance on UE and required min to mod A to ambulate a few feet using RW. Pt reports she was previously independent with use of rollator and reports she is worried she will not be able to perform necessary mobility tasks. Feel pt will need short term SNF at d/c to increase independence prior to return home. Will continue to follow acutely to maximize functional mobility independence and safety.     Follow Up Recommendations SNF;Supervision for mobility/OOB    Equipment Recommendations  Rolling walker with 5" wheels(bariatric )    Recommendations for Other Services       Precautions / Restrictions Precautions Precautions: Fall Restrictions Weight Bearing Restrictions: No      Mobility  Bed Mobility               General bed mobility comments: In chair for mobility.   Transfers Overall transfer level: Needs assistance Equipment used: None;Rolling walker (2 wheeled) Transfers: Sit to/from [employer] Sit to Stand: Mod assist Stand pivot transfers: Min assist       General transfer comment: Pt requiring mod A and increased time to stand. Pt performing stand pivot transfer unconventionally and grabbing to rails of BSC and pivoting around. Pt unable to achieve upright during transfer to [facility] as pt with increased pain. Used RW to stand during second attempt and pt requiring mod A for lift assist and steadying. Trunk flexion in standing as pt heavily reliant on UEs secondary to pain.    Ambulation/Gait Ambulation/Gait assistance: Min assist;Mod assist Gait Distance (Feet): 2 Feet Assistive device: Rolling walker (2 wheeled) Gait Pattern/deviations: Step-to pattern;Decreased step length - right;Decreased step length - left;Antalgic;Trunk flexed Gait velocity: Decreased    General Gait Details: Slow, very antalgic gait. Difficulty taking steps from [facility] to recliner secondary to pain. Unable to achieve full upright secondary to pain in BLE. Further mobility deferred. Oxygen sats at 94% on RA during mobility.   Stairs            Wheelchair Mobility    Modified Rankin (Stroke Patients Only)       Balance Overall balance assessment: Needs assistance Sitting-balance support: No upper extremity supported;Feet supported Sitting balance-Leahy Scale: Fair     Standing balance support: Bilateral upper extremity supported;During functional activity Standing balance-Leahy Scale: Poor Standing balance comment: Heavy reliance on UE support.                              Pertinent Vitals/Pain Pain Assessment: 0-10 Pain Score: 8  Pain Location: bilat LEs Pain Descriptors / Indicators: Grimacing;Guarding;Constant Pain Intervention(s): Limited activity within patient's tolerance;Monitored during session;Repositioned    Home Living Family/patient expects to be discharged to:: Private residence Living Arrangements: Spouse/significant other Available Help at Discharge: Family;Available 24 hours/day Type of Home: House Home Access: Stairs to enter Entrance Stairs-Rails: None Entrance Stairs-Number of Steps: 1 Home Layout: One level Home Equipment: Toilet riser;Shower seat;Walker - 4 wheels      Prior Function Level of Independence: Independent  with assistive device(s)         Comments: Used rollator when out of the house      Hand Dominance   Dominant Hand: Right    Extremity/Trunk Assessment   Upper Extremity Assessment Upper Extremity  Assessment: Generalized weakness    Lower Extremity Assessment Lower Extremity Assessment: RLE deficits/detail;LLE deficits/detail RLE Deficits / Details: Reports R sided LE spasms LLE Deficits / Details: LLE pain from DVT    Cervical / Trunk Assessment Cervical / Trunk Assessment: Kyphotic  Communication   Communication: No difficulties  Cognition Arousal/Alertness: Awake/alert Behavior During Therapy: WFL for tasks assessed/performed Overall Cognitive Status: Within Functional Limits for tasks assessed                                        General Comments      Exercises     Assessment/Plan    PT Assessment Patient needs continued PT services  PT Problem List Decreased strength;Decreased balance;Decreased mobility;Decreased knowledge of use of DME;Pain;Decreased activity tolerance       PT Treatment Interventions DME instruction;Gait training;Functional mobility training;Therapeutic activities;Therapeutic exercise;Patient/family education;Balance training    PT Goals (Current goals can be found in the Care Plan section)  Acute Rehab PT Goals Patient Stated Goal: to get better before going home PT Goal Formulation: With patient Time For Goal Achievement: 12/22/17 Potential to Achieve Goals: Fair    Frequency Min 2X/week   Barriers to discharge        Co-evaluation               AM-PAC PT "6 Clicks" Daily Activity  Outcome Measure Difficulty turning over in bed (including adjusting bedclothes, sheets and blankets)?: Unable Difficulty moving from lying on back to sitting on the side of the bed? : Unable Difficulty sitting down on and standing up from a chair with arms (e.g., wheelchair, bedside commode, etc,.)?: Unable Help needed moving to and from a bed to chair (including a wheelchair)?: A Lot Help needed walking in hospital room?: A Lot Help needed climbing 3-5 steps with a railing? : Total 6 Click Score: 8    End of Session    Activity Tolerance: Patient limited by pain Patient left: in chair;with call bell/phone within reach Nurse Communication: Mobility status PT Visit Diagnosis: Unsteadiness on feet (R26.81);Muscle weakness (generalized) (M62.81);Difficulty in walking, not elsewhere classified (R26.2);Pain Pain - Right/Left: (bilateral ) Pain - part of body: Leg    Time: 1610-9604 PT Time Calculation (min) (ACUTE ONLY): 30 min   Charges:   PT Evaluation $PT Eval Moderate Complexity: 1 Mod PT Treatments $Therapeutic Activity: 8-22 mins        Gladys Damme, PT, DPT  Acute Rehabilitation Services  Pager: [phone] Office: [phone]   Holly Rodgers 12/08/2017, 10:20 AM

## 2017-12-08 NOTE — Discharge Summary (Addendum)
Holly Rodgers:[id number] DOB: [birth year] DOA: 12/05/2017  PCP: Kyung Rudd, MD  Admit date: 12/05/2017  Discharge date: 12/11/2017  Admitted From: Home   Disposition:  Home   Recommendations for Outpatient Follow-up:   Follow up with PCP in 1-2 weeks  PCP Please obtain BMP/CBC, 2 view CXR in 1week,  (see Discharge instructions)   PCP Please follow up on the following pending results: Monitor INR closely and adjust Lovenox and Coumadin dose as needed, needs outpatient pulmonary follow-up.   Home Health: PT, RN Equipment/Devices: Rolling walker, o2 if qualifies  Consultations: Pulm, IR Discharge Condition: Fair   CODE STATUS: Full   Diet Recommendation: Heart Healthy Low Carb    Chief Complaint  Patient presents with  . Shortness of Breath     Brief history of present illness from the day of admission and additional interim summary    Holly Rodgers a 47 year old African-American female, morbidly obese, with past medical history significant for hypertension, diabetes mellitus, depression, anxiety and chronic pain.  Patient presented with shortness of breath, dyspnea on minimal exertion, bilateral lower extremity pain and swelling, worse on the left side.  CT Angie of the chest done on presentation was "Positive for acute PE with CT evidence of right heart strain (RV/LV Ratio = 1.5) consistent with at least submassive (intermediate risk) PE. The presence of right heart strain has been associated with an increased risk of morbidity and mortality.  Stable 5 mm right lower lobe pulmonary nodule".  Vascular ultrasound of the lower extremities revealed "Occlusive thrombus left popliteal vein".  Patient is currently on heparin drip.  Patient's vital signs are stable.  Patient is on room air.   According to the patient, due to pain, patient was minimally active at home.                                                                  Hospital Course   Acute saddle PE without acute cor pulmonale - along with positive left leg DVT, was on birth control, history of smoking and morbidly obese.  Birth control held, initially kept on IV heparin, echocardiogram satisfactory with no suggestion of right heart strain, pulmonary artery pressure 34 with only mild elevation.  Hemodynamically stable and at rest on room air.  Has been transitioned to Lovenox and Coumadin overlap due to her body weight.  Will get home oxygen evaluation upon ambulation and discharge home, will get oxygen if qualifies upon ambulation.  Will get 7-day supply of Coumadin and Lovenox shots, requested to follow with PCP in 3 days to get INR monitored closely and get Coumadin and Lovenox dose adjusted as needed.  She will also require outpatient pulmonary follow-up for a lung nodule that was noted on CT scan.   Super morbid obesity -  Diet and exercise, Needs to loose weight.Follow with PCP for the same.  DM2 - diet controlled.  Follow with PCP.  Lab Results  Component Value Date   HGBA1C 7.4 (H) 12/06/2017    HTN -Continue Cardizem and Toprol XL, continue to optimize  Pulmonary nodule - Incidental finding, She has a 5-year smoking history and so is at relatively low risk,one-time outpatient pulmonary follow-up for monitoring and follow-up  UTI.  Received 3 days of IV Rocephin treatment.  Resolved clinically.  Generalized weakness and deconditioning.   Think most of this was due to noncooperation, yesterday patient walked after much encouragement to the nursing station and back, and this morning she wants to go home and not SNF, she has already been provided with rolling walker, she will also get home PT and RN.  Will be discharged today home.   Discharge diagnosis     Principal Problem:   Acute saddle pulmonary  embolism with acute cor pulmonale (HCC) Active Problems:   Pulmonary nodule   Morbid obesity with BMI of 50.0-59.9, adult (HCC)   Essential hypertension   Diabetes mellitus type 2 in obese [facility])    Discharge instructions    Discharge Instructions    Diet - low sodium heart healthy   Complete by:  As directed    Discharge instructions   Complete by:  As directed    Follow with Primary MD Kyung Rudd, MD in 3 days   Get CBC, CMP, INR  -  checked  by Primary MD in 3 days    Activity: As tolerated with Full fall precautions use walker/cane & assistance as needed  Disposition Home    Diet: Heart Healthy  Low Carb   Special Instructions: If you have smoked or chewed Tobacco  in the last 2 yrs please stop smoking, stop any regular Alcohol  and or any Recreational drug use.  On your next visit with your primary care physician please Get Medicines reviewed and adjusted.  Please request your Prim.MD to go over all Hospital Tests and Procedure/Radiological results at the follow up, please get all Hospital records sent to your Prim MD by signing hospital release before you go home.  If you experience worsening of your admission symptoms, develop shortness of breath, life threatening emergency, suicidal or homicidal thoughts you must seek medical attention immediately by calling 911 or calling your MD immediately  if symptoms less severe.  You Must read complete instructions/literature along with all the possible adverse reactions/side effects for all the Medicines you take and that have been prescribed to you. Take any new Medicines after you have completely understood and accpet all the possible adverse reactions/side effects.   Increase activity slowly   Complete by:  As directed       Discharge Medications   Allergies as of 12/11/2017      Reactions   Metformin    Other reaction(s): GI Upset (intolerance)   Metformin And Related       Medication List    STOP taking these  medications   diclofenac 50 MG EC tablet Commonly known as:  VOLTAREN   medroxyPROGESTERone 10 MG tablet Commonly known as:  PROVERA     TAKE these medications   acetaminophen 500 MG tablet Commonly known as:  TYLENOL Take 1 tablet (500 mg total) by mouth every 8 (eight) hours as needed for moderate pain.   cetirizine 10 MG tablet Commonly known as:  ZYRTEC Take 10 mg by mouth daily.   coumadin  book Misc 1 each by Does not apply route once for 1 dose.   diltiazem 300 MG 24 hr capsule Commonly known as:  CARDIZEM CD Take 300 mg by mouth daily.   enoxaparin 300 MG/3ML Soln injection Commonly known as:  LOVENOX Inject 1.6 mLs (160 mg total) into the skin every 12 (twelve) hours. Get INR checked in 3 days by PCP and get Coumadin and Lovenox dose adjusted as needed.   gabapentin 600 MG tablet Commonly known as:  NEURONTIN Take 600 mg by mouth 3 (three) times daily.   metoprolol succinate 50 MG 24 hr tablet Commonly known as:  TOPROL-XL Take 50 mg by mouth daily. Take with or immediately following a meal.   sertraline 100 MG tablet Commonly known as:  ZOLOFT Take 100 mg by mouth daily.   tiZANidine 4 MG capsule Commonly known as:  ZANAFLEX Take 4-8 mg by mouth 2 (two) times daily as needed for muscle spasms (Take 1 tablet in the morning, and 2 tablets at bedtime).   Vitamin D3 2000 units capsule Take 2,000 Units by mouth daily.   warfarin 7.5 MG tablet Commonly known as:  COUMADIN Take 1 tablet (7.5 mg total) by mouth one time only at 6 PM. get INR checked by primary care physician in 3 days and get Coumadin and Lanoxin dose adjusted            Durable Medical Equipment  (From admission, onward)         Start     Ordered   12/08/17 0913  For home use only DME Walker rolling  [facility])  Once    Question:  Patient needs a walker to treat with the following condition  Answer:  Weakness   12/08/17 0912           Contact information for follow-up providers      Oretha Milch, MD. Schedule an appointment as soon as possible for a visit in 1 month(s).   Specialty:  Pulmonary Disease Why:  Large PE, lung nodule Contact information: [address] [place] [address] [phone]        [facility] - Dme Follow up.   Why:  bariatric walker Contact information: [address] [place] [address] [phone]        Health, [facility]-Home Follow up.   Specialty:  Home Health Services Why:  Registered Nurse, Physical therapist Contact information: [address] [place] [address] [phone]        Dr [name] Follow up on 12/27/2017.   Why:  Appt at 2:20pm.  HHRN will draw INR and communicate results iwth PCP.  IF you do not hear from your PCP post the bllod work please promplty follow up with office. Contact information: [address]  [address]  [place], [place] [address]            Contact information for after-discharge care    Destination    [facility] .   Service:  Skilled Nursing Contact information: [address] [place] [address] [phone]                  Major procedures and Radiology Reports - PLEASE review detailed and final reports thoroughly  -      Leg [place] - Occlusive thrombus left popliteal vein. No deep vein thrombosis within the right lower extremity.   TTE  - Left ventricle: The cavity size was normal.  Wall thickness was normal. Systolic function was vigorous. The estimated ejection fraction was in the range of 65% to 70%. Wall motion was normal; there were no regional wall motion abnormalities. Left ventricular diastolic function parameters were normal for the patient&'s age. - Right atrium: The atrium was mildly dilated. - Pulmonary arteries: Systolic pressure was mildly increased. PA peak pressure: 34 mm Hg (S).   Dg Chest 2 View  Result Date: 12/05/2017 CLINICAL DATA:  Shortness of breath and  dizziness EXAM: CHEST - 2 VIEW COMPARISON:  September 18, 2017 FINDINGS: The heart size and mediastinal contours are within normal limits. Both lungs are clear. The visualized skeletal structures are unremarkable. IMPRESSION: No active cardiopulmonary disease. Electronically Signed   By: Sherian Rein M.D.   On: 12/05/2017 21:44   Ct Angio Chest Pe W And/or Wo Contrast  Result Date: 12/06/2017 CLINICAL DATA:  47 year old female with shortness of breath. Concern for pulmonary embolism. EXAM: CT ANGIOGRAPHY CHEST WITH CONTRAST TECHNIQUE: Multidetector CT imaging of the chest was performed using the standard protocol during bolus administration of intravenous contrast. Multiplanar CT image reconstructions and MIPs were obtained to evaluate the vascular anatomy. CONTRAST:  ISOVUE-370 IOPAMIDOL (ISOVUE-370) INJECTION 76% COMPARISON:  Chest radiograph dated 12/05/2017 and CT dated 04/14/2017 FINDINGS: Cardiovascular: There is no cardiomegaly or pericardial effusion. The thoracic aorta is unremarkable. There is a nonocclusive bandlike embolus straddling the bifurcation of the main pulmonary trunk and extending to the central pulmonary arteries consistent with a saddle embolus. There is occlusive thrombus in the lobar branches of the right upper lobe. Nonocclusive clot noted in the lower lobes bilaterally. There is mild enlargement of the right ventricle. The right ventricular diameter is 44.5 mm and the left ventricular diameter is 44 mm with a ratio of approximately 1 consistent with a degree of right heart straining. Mediastinum/Nodes: No hilar or mediastinal adenopathy. The esophagus is grossly unremarkable. There is asymmetric enlargement of the right thyroid lobe. Ultrasound may provide better evaluation on a nonemergent basis. No mediastinal fluid collection. Lungs/Pleura: No focal consolidation, pleural effusion, or pneumothorax. There is a 5 mm right lower lobe nodule (series 9, image 46) similar to prior  CT. The central airways are patent. Upper Abdomen: Apparent fatty infiltration of the liver. The visualized upper abdomen is otherwise unremarkable. Musculoskeletal: No chest wall abnormality. No acute or significant osseous findings. Review of the MIP images confirms the above findings. IMPRESSION: 1. Positive for acute PE with CT evidence of right heart strain (RV/LV Ratio = 1.5) consistent with at least submassive (intermediate risk) PE. The presence of right heart strain has been associated with an increased risk of morbidity and mortality. Please activate Code PE by paging [phone]. 2. Stable 5 mm right lower lobe pulmonary nodule. No follow-up needed if patient is low-risk. Non-contrast chest CT can be considered in 12 months if patient is high-risk. This recommendation follows the consensus statement: Guidelines for Management of Incidental Pulmonary Nodules Detected on CT Images: From the Fleischner Society 2017; Radiology 2017; 284:228-243. These results were called by telephone at the time of interpretation on 12/06/2017 at 2:37 am to Dr. Paula Libra , who verbally acknowledged these results. Electronically Signed   By: Elgie Collard M.D.   On: 12/06/2017 02:40   US Venous Img Lower Bilateral  Result Date: 12/06/2017 CLINICAL DATA:  Bilateral lower extremity pain and swelling left worse than right 4 days. Shortness of breath. EXAM: BILATERAL LOWER EXTREMITY VENOUS DOPPLER ULTRASOUND TECHNIQUE: Gray-scale sonography with graded compression, as  well as color Doppler and duplex ultrasound were performed to evaluate the lower extremity deep venous systems from the level of the common femoral vein and including the common femoral, femoral, profunda femoral, popliteal and calf veins including the posterior tibial, peroneal and gastrocnemius veins when visible. The superficial great saphenous vein was also interrogated. Spectral Doppler was utilized to evaluate flow at rest and with distal  augmentation maneuvers in the common femoral, femoral and popliteal veins. COMPARISON:  None. FINDINGS: RIGHT LOWER EXTREMITY Common Femoral Vein: No evidence of thrombus. Normal compressibility, respiratory phasicity and response to augmentation. Saphenofemoral Junction: No evidence of thrombus. Normal compressibility and flow on color Doppler imaging. Profunda Femoral Vein: No evidence of thrombus. Normal compressibility and flow on color Doppler imaging. Femoral Vein: No evidence of thrombus. Normal compressibility, respiratory phasicity and response to augmentation. Popliteal Vein: No evidence of thrombus. Normal compressibility, respiratory phasicity and response to augmentation. Calf Veins: No evidence of thrombus. Normal compressibility and flow on color Doppler imaging. Superficial Great Saphenous Vein: No evidence of thrombus. Normal compressibility. Venous Reflux:  None. Other Findings:  None. LEFT LOWER EXTREMITY Common Femoral Vein: No evidence of thrombus. Normal compressibility, respiratory phasicity and response to augmentation. Saphenofemoral Junction: No evidence of thrombus. Normal compressibility and flow on color Doppler imaging. Profunda Femoral Vein: No evidence of thrombus. Normal compressibility and flow on color Doppler imaging. Femoral Vein: No evidence of thrombus. Normal compressibility, respiratory phasicity and response to augmentation. Popliteal Vein: Short segment occlusive thrombus which is noncompressible. Calf Veins: No evidence of thrombus. Normal compressibility and flow on color Doppler imaging. Superficial Great Saphenous Vein: No evidence of thrombus. Normal compressibility. Venous Reflux:  None. Other Findings:  Left knee joint effusion. IMPRESSION: Occlusive thrombus left popliteal vein. No deep vein thrombosis within the right lower extremity. Critical Value/emergent results were called by telephone at the time of interpretation on 12/06/2017 at 2:06 am to Dr. Paula Libra ,  who verbally acknowledged these results. Electronically Signed   By: Elberta Fortis M.D.   On: 12/06/2017 02:06    Micro Results     Recent Results (from the past 240 hour(s))  Culture, Urine     Status: Abnormal   Collection Time: 12/07/17  8:14 AM  Result Value Ref Range Status   Specimen Description URINE, RANDOM  Final   Special Requests   Final    Normal Performed at [facility], [address], [place], [place] [address]    Culture >=100,000 COLONIES/mL ESCHERICHIA COLI (A)  Final   Report Status 12/09/2017 FINAL  Final   Organism ID, Bacteria ESCHERICHIA COLI (A)  Final      Susceptibility   Escherichia coli - MIC*    AMPICILLIN <=2 SENSITIVE Sensitive     CEFAZOLIN <=4 SENSITIVE Sensitive     CEFTRIAXONE <=1 SENSITIVE Sensitive     CIPROFLOXACIN <=0.25 SENSITIVE Sensitive     GENTAMICIN <=1 SENSITIVE Sensitive     IMIPENEM <=0.25 SENSITIVE Sensitive     NITROFURANTOIN <=16 SENSITIVE Sensitive     TRIMETH/SULFA <=20 SENSITIVE Sensitive     AMPICILLIN/SULBACTAM <=2 SENSITIVE Sensitive     PIP/TAZO <=4 SENSITIVE Sensitive     Extended ESBL NEGATIVE Sensitive     * >=100,000 COLONIES/mL ESCHERICHIA COLI    Today   Subjective    [place] today has no headache,no chest abdominal pain,no new weakness tingling or numbness, feels much better wants to go home today.     Objective   Blood pressure Marland Kitchen)  127/51, pulse 79, temperature 98.8 F (37.1 C), temperature source Oral, resp. rate (!) 21, height 5\' 11"  (1.803 m), weight (!) 190.5 kg, last menstrual period 11/23/2017, SpO2 99 % RA.   Intake/Output Summary (Last 24 hours) at 12/11/2017 1121 Last data filed at 12/11/2017 0556 Gross per 24 hour  Intake 720 ml  Output 1200 ml  Net -480 ml    Exam  Awake Alert, Oriented x 3, No new F.N deficits, Normal affect [place].AT,PERRAL Supple Neck,No JVD, No cervical lymphadenopathy appriciated.  Symmetrical Chest wall movement, Good air movement bilaterally,  CTAB RRR,No Gallops,Rubs or new Murmurs, No Parasternal Heave +ve B.Sounds, Abd Soft, Non tender, No organomegaly appriciated, No rebound -guarding or rigidity. No Cyanosis, Clubbing or edema, No new Rash or bruise   Data Review   CBC w Diff:  Lab Results  Component Value Date   WBC 6.2 12/11/2017   HGB 10.1 (L) 12/11/2017   HCT 34.9 (L) 12/11/2017   PLT 219 12/11/2017   LYMPHOPCT 18 12/06/2017   MONOPCT 6 12/06/2017   EOSPCT 1 12/06/2017   BASOPCT 0 12/06/2017    CMP:  Lab Results  Component Value Date   NA 134 (L) 12/08/2017   K 3.6 12/08/2017   CL 102 12/08/2017   CO2 26 12/08/2017   BUN 12 12/08/2017   CREATININE 0.90 12/08/2017  .  Lab Results  Component Value Date   INR 1.28 12/11/2017   INR 1.20 12/10/2017   INR 1.14 12/09/2017    Total Time in preparing paper work, data evaluation and todays exam - 35 minutes  Susa Raring M.D on 12/11/2017 at 11:21 AM  [facility]   Office  [phone]

## 2017-12-09 LAB — URINE CULTURE
Culture: >=100000 — AB
Special Requests: NORMAL

## 2017-12-09 LAB — CBC
HCT: 31.9 % — ABNORMAL LOW (ref 36.0–46.0)
Hemoglobin: 9.2 g/dL — ABNORMAL LOW (ref 12.0–15.0)
MCH: 23.3 pg — ABNORMAL LOW (ref 26.0–34.0)
MCHC: 28.8 g/dL — ABNORMAL LOW (ref 30.0–36.0)
MCV: 80.8 fL (ref 80.0–100.0)
Platelets: 180 10*3/uL (ref 150–400)
RBC: 3.95 MIL/uL (ref 3.87–5.11)
RDW: 18.6 % — ABNORMAL HIGH (ref 11.5–15.5)
WBC: 7.3 10*3/uL (ref 4.0–10.5)
nRBC: 0.3 % — ABNORMAL HIGH (ref 0.0–0.2)

## 2017-12-09 LAB — GLUCOSE, CAPILLARY
Glucose-Capillary: 113 mg/dL — ABNORMAL HIGH (ref 70–99)
Glucose-Capillary: 191 mg/dL — ABNORMAL HIGH (ref 70–99)
Glucose-Capillary: 136 mg/dL — ABNORMAL HIGH (ref 70–99)
Glucose-Capillary: 187 mg/dL — ABNORMAL HIGH (ref 70–99)

## 2017-12-09 LAB — HEPARIN LEVEL (UNFRACTIONATED): Heparin Unfractionated: 1.26 IU/mL — ABNORMAL HIGH (ref 0.30–0.70)

## 2017-12-09 LAB — PROTIME-INR
INR: 1.14
PROTHROMBIN TIME: 14.5 s (ref 11.4–15.2)

## 2017-12-09 MED ORDER — WARFARIN SODIUM 10 MG PO TABS
10.0000 mg | ORAL_TABLET | Freq: Once | ORAL | Status: AC
  Administered 2017-12-09: 10 mg via ORAL
  Filled 2017-12-09: qty 1

## 2017-12-09 MED ORDER — ENOXAPARIN SODIUM 300 MG/3ML IJ SOLN
160.0000 mg | Freq: Two times a day (BID) | INTRAMUSCULAR | Status: DC
  Administered 2017-12-09 – 2017-12-11 (×4): 160 mg via SUBCUTANEOUS
  Filled 2017-12-09 (×5): qty 1.6

## 2017-12-09 MED ORDER — SODIUM CHLORIDE 0.9 % IV SOLN
1.0000 g | INTRAVENOUS | Status: AC
  Administered 2017-12-09 – 2017-12-11 (×3): 1 g via INTRAVENOUS
  Filled 2017-12-09 (×3): qty 10

## 2017-12-09 MED ORDER — DIPHENHYDRAMINE HCL 25 MG PO CAPS
25.0000 mg | ORAL_CAPSULE | ORAL | Status: DC | PRN
  Administered 2017-12-10: 25 mg via ORAL
  Filled 2017-12-09: qty 1

## 2017-12-09 NOTE — Progress Notes (Signed)
Patient had to void so we transferred patient to bedside commode. Tolerated movement well. Offered to take her for a walk while she was up and out of the bed. Patient verbalized no. Encouraged her to walk or at least sit in the chair to prevent staying in the bed and deconditioning and she stated she would sit in the chair for a while but not walk. Encouraged her to walk prior to getting back in the bed and stressed importance of maintaining mobility.

## 2017-12-09 NOTE — Progress Notes (Signed)
ANTICOAGULATION CONSULT NOTE - Follow Up Consult  Pharmacy Consult for heparin >> enox + warfarin Indication: pulmonary embolus  Labs: Recent Labs    12/06/17 1035  12/06/17 1524 12/06/17 2109  12/07/17 0305 12/07/17 1011 12/08/17 0220 12/08/17 0719 12/09/17 0233  HGB  --   --   --   --    < > 9.6*  --  9.3*  --  9.2*  HCT  --   --   --   --   --  33.6*  --  32.8*  --  31.9*  PLT  --   --   --   --   --  209  --  186  --  180  LABPROT  --   --   --   --   --   --   --  13.1  --  14.5  INR  --   --   --   --   --   --   --  1.00  --  1.14  HEPARINUNFRC  --    < > 0.58 0.27*  --  0.24* 0.30 0.14*  --   --   CREATININE  --   --   --   --   --  1.27*  --   --  0.90  --   TROPONINI <0.03  --  <0.03 0.03*  --   --   --   --   --   --    < > = values in this interval not displayed.    Assessment: 47yo female on heparin infusion for saddle PE.  Given weight>120 kg (wt documented today at 190 kg), will start enoxaparin bridge with warfarin. The ISTH 2016 guidelines suggest avoiding using apixaban due to lack of clinical data in obese populations. Hgb 9.2, plt 180. No s/sx of bleeding.  HL checked ~4.5 hours after third dose, came back above goal at 1.26 on enoxaparin 191 mg [place] q12h.  Will decrease dose and dose to available syringes.  INR 1.14 s/p warfarin 10 mg x1.    Goal of Therapy:  Enoxaparin reference range: 0.6 to 1 units/mL INR goal: 2-3   Plan:  Decrease enoxaparin to 160 mg [place] q12h Warfarin 10 mg x1 Monitor daily INR, CBC, and for medication interactions Recommend rechecking enoxaparin level drawn 4-6 hours after 3rd or 4th dose of new regimen   Marcelino Freestone, PharmD PGY2 Cardiology Pharmacy Resident Phone [phone] Please check AMION for all Pharmacist numbers by unit 12/09/2017 8:57 AM

## 2017-12-09 NOTE — Plan of Care (Signed)

## 2017-12-09 NOTE — Progress Notes (Signed)
Offered patient to walk 3 different times. At each interaction with patient. Patient states she is too tired, in too much pain and wants to "just walk later". Offered pain control and patient has taken it once. Patient is currently resting in bed. Encouraged patient that walking will help her transition to home quicker and in a safe manner. Patient stated she would walk with me before I leave for the end of my shift. Will continue to monitor.

## 2017-12-09 NOTE — Progress Notes (Signed)
PROGRESS NOTE                                                                                                                                                                                                             Patient Demographics:    Holly Rodgers, is a 47 y.o. female, DOB - 1970/07/04, ZOX:[id number]  Admit date - 12/05/2017   Admitting Physician Hillary Bow, DO  Outpatient Primary MD for the patient is Kyung Rudd, MD  LOS - 3  Chief Complaint  Patient presents with  . Shortness of Breath       Brief Narrative    Holly Rodgers a 47 year old African-American female, morbidly obese, with past medical history significant for hypertension, diabetes mellitus, depression, anxiety and chronic pain. Patient presented with shortness of breath, dyspnea on minimal exertion, bilateral lower extremity pain and swelling, worse on the left side. CT Angie of the chest done on presentation was"Positive for acute PE with CT evidence of right heart strain (RV/LV Ratio = 1.5) consistent with at least submassive (intermediate risk) PE. The presence of right heart strain has been associated with an increased risk of morbidity and mortality. Stable 5 mm right lower lobe pulmonary nodule".Vascular ultrasound of the lower extremities revealed "Occlusive thrombus left popliteal vein".Patient is currently on heparin drip. Patient's vital signs are stable. Patient is on room air. According to the patient, due to pain, patient was minimally active at home.     Subjective:    Holly Rodgers today has, No headache, No chest pain, No abdominal pain - No Nausea, No new weakness tingling or numbness, No Cough - SOB.     Assessment  & Plan :    Acute saddle PE without acute cor pulmonale - along with positive left leg DVT, was on birth control, history of smoking and morbidly obese.  Birth control held, initially kept on IV heparin, echocardiogram satisfactory  with no suggestion of right heart strain, pulmonary artery pressure 34 with only mild elevation.  Hemodynamically stable and at rest on room air. She was DC'd home however according to PT she needs to go to SNF due to deconditioning which is rather surprising, await bed encouraged to increase activity continue PT .  HTN - Continue Cardizem and Toprol XL, Continue to optimize  Pulmonary nodule   -Incidental finding, She has a 5-year smoking history and so is at relatively low risk, one-time outpatient pulmonary follow-up for monitoring and follow-up  Super morbid obesity -Diet and  exercise, Needs to loose weight. Follow with PCP for the same.  DM2 - diet controlled.  Follow with PCP.  Generalized weakness and deconditioning.  Apparently needs SNF now, continue PT.  Will try to ambulate and reevaluate.   Family Communication  :  None  Code Status :  Full  Disposition Plan  :  Home in 1-2 days VS SNF  Consults  :  PCCM and IR  Procedures  :    Leg [place] - Occlusive thrombus left popliteal vein. No deep vein thrombosis within the right lower extremity.   TTE  - Left ventricle: The cavity size was normal. Wall thickness wasnormal. Systolic function was vigorous. The estimated ejectionfraction was in the range of 65% to 70%. Wall motion was normal;there were no regional wall motion abnormalities. Leftventricular diastolic function parameters were normal for thepatient&'s age. - Right atrium: The atrium was mildly dilated. - Pulmonary arteries: Systolic pressure was mildly increased. PApeak pressure: 34 mm Hg (S).   DVT Prophylaxis  :  Lovenox/Coumadin  Lab Results  Component Value Date   PLT 180 12/09/2017   Lab Results  Component Value Date   INR 1.14 12/09/2017   INR 1.00 12/08/2017    Diet :  Diet Order            Diet - low sodium heart healthy        Diet regular Room service appropriate? Yes; Fluid consistency: Thin  Diet effective now                Inpatient Medications Scheduled Meds: . diltiazem  300 mg Oral Daily  . docusate sodium  100 mg Oral BID  . enoxaparin (LOVENOX) injection  1 mg/kg Subcutaneous Q12H  . gabapentin  600 mg Oral TID  . Influenza vac split quadrivalent PF  0.5 mL Intramuscular Tomorrow-1000  . insulin aspart  0-20 Units Subcutaneous TID WC  . metoprolol succinate  50 mg Oral Daily  . pneumococcal 23 valent vaccine  0.5 mL Intramuscular Tomorrow-1000  . sertraline  100 mg Oral Daily  . sodium chloride flush  3 mL Intravenous Q12H  . warfarin   Does not apply Once  . Warfarin - Pharmacist Dosing Inpatient   Does not apply q1800   Continuous Infusions: . cefTRIAXone (ROCEPHIN)  IV 1 g (12/09/17 0901)   PRN Meds:.acetaminophen **OR** [DISCONTINUED] acetaminophen, HYDROmorphone (DILAUDID) injection, [DISCONTINUED] ondansetron **OR** ondansetron (ZOFRAN) IV, tiZANidine  Antibiotics  :   Anti-infectives (From admission, onward)   Start     Dose/Rate Route Frequency Ordered Stop   12/09/17 0630  cefTRIAXone (ROCEPHIN) 1 g in sodium chloride 0.9 % 100 mL IVPB     1 g 200 mL/hr over 30 Minutes Intravenous Every 24 hours 12/09/17 0628 12/12/17 0629          Objective:   Vitals:   12/08/17 1625 12/08/17 2306 12/09/17 0553 12/09/17 0748  BP: (!) 153/74 (!) 106/52 114/78 128/62  Pulse:      Resp: (!) 23 (!) 21  (!) 21  Temp: 99.1 F (37.3 C) 99.3 F (37.4 C) 98.7 F (37.1 C) 98.4 F (36.9 C)  TempSrc: Oral Oral Oral Oral  SpO2:   97%   Weight:      Height:        Wt Readings from Last 3 Encounters:  12/05/17 (!) 190.5 kg  08/19/10 108.9 kg     Intake/Output Summary (Last 24 hours) at 12/09/2017 1046 Last data filed at 12/09/2017 1610 Gross per  24 hour  Intake 460 ml  Output 300 ml  Net 160 ml     Physical Exam  Awake Alert, Oriented X 3, No new F.N deficits, Normal affect [place].AT,PERRAL Supple Neck,No JVD, No cervical lymphadenopathy appriciated.  Symmetrical Chest wall movement,  Good air movement bilaterally, CTAB RRR,No Gallops,Rubs or new Murmurs, No Parasternal Heave +ve B.Sounds, Abd Soft, No tenderness, No organomegaly appriciated, No rebound - guarding or rigidity. No Cyanosis, Clubbing or edema, L.Leg 1+ swelling   Data Review:    CBC Recent Labs  Lab 12/06/17 0010 12/07/17 0305 12/08/17 0220 12/09/17 0233  WBC 10.2 8.7 8.3 7.3  HGB 10.8* 9.6* 9.3* 9.2*  HCT 35.8* 33.6* 32.8* 31.9*  PLT 215 209 186 180  MCV 79.2* 81.4 81.4 80.8  MCH 23.9* 23.2* 23.1* 23.3*  MCHC 30.2 28.6* 28.4* 28.8*  RDW 18.6* 18.6* 18.5* 18.6*  LYMPHSABS 1.8  --   --   --   MONOABS 0.6  --   --   --   EOSABS 0.1  --   --   --   BASOSABS 0.0  --   --   --     Chemistries  Recent Labs  Lab 12/06/17 0010 12/07/17 0305 12/08/17 0719  NA 136 135 134*  K 3.6 3.4* 3.6  CL 101 103 102  CO2 25 26 26   GLUCOSE 123* 170* 112*  BUN 14 12 12   CREATININE 0.87 1.27* 0.90  CALCIUM 9.1 8.8* 9.1  MG  --   --  2.0   ------------------------------------------------------------------------------------------------------------------ No results for input(s): CHOL, HDL, LDLCALC, TRIG, CHOLHDL, LDLDIRECT in the last 72 hours.  Lab Results  Component Value Date   HGBA1C 7.4 (H) 12/06/2017   ------------------------------------------------------------------------------------------------------------------ No results for input(s): TSH, T4TOTAL, T3FREE, THYROIDAB in the last 72 hours.  Invalid input(s): FREET3 ------------------------------------------------------------------------------------------------------------------ No results for input(s): VITAMINB12, FOLATE, FERRITIN, TIBC, IRON, RETICCTPCT in the last 72 hours.  Coagulation profile Recent Labs  Lab 12/08/17 0220 12/09/17 0233  INR 1.00 1.14    No results for input(s): DDIMER in the last 72 hours.  Cardiac Enzymes Recent Labs  Lab 12/06/17 1035 12/06/17 1524 12/06/17 2109  TROPONINI <0.03 <0.03 0.03*    ------------------------------------------------------------------------------------------------------------------    Component Value Date/Time   BNP 122.3 (H) 12/06/2017 0010    Micro Results Recent Results (from the past 240 hour(s))  Culture, Urine     Status: Abnormal   Collection Time: 12/07/17  8:14 AM  Result Value Ref Range Status   Specimen Description URINE, RANDOM  Final   Special Requests   Final    Normal Performed at [facility], [address], [place], [place] [address]    Culture >=100,000 COLONIES/mL ESCHERICHIA COLI (A)  Final   Report Status 12/09/2017 FINAL  Final   Organism ID, Bacteria ESCHERICHIA COLI (A)  Final      Susceptibility   Escherichia coli - MIC*    AMPICILLIN <=2 SENSITIVE Sensitive     CEFAZOLIN <=4 SENSITIVE Sensitive     CEFTRIAXONE <=1 SENSITIVE Sensitive     CIPROFLOXACIN <=0.25 SENSITIVE Sensitive     GENTAMICIN <=1 SENSITIVE Sensitive     IMIPENEM <=0.25 SENSITIVE Sensitive     NITROFURANTOIN <=16 SENSITIVE Sensitive     TRIMETH/SULFA <=20 SENSITIVE Sensitive     AMPICILLIN/SULBACTAM <=2 SENSITIVE Sensitive     PIP/TAZO <=4 SENSITIVE Sensitive     Extended ESBL NEGATIVE Sensitive     * >=100,000 COLONIES/mL ESCHERICHIA COLI    Radiology  Reports Dg Chest 2 View  Result Date: 12/05/2017 CLINICAL DATA:  Shortness of breath and dizziness EXAM: CHEST - 2 VIEW COMPARISON:  September 18, 2017 FINDINGS: The heart size and mediastinal contours are within normal limits. Both lungs are clear. The visualized skeletal structures are unremarkable. IMPRESSION: No active cardiopulmonary disease. Electronically Signed   By: Sherian Rein M.D.   On: 12/05/2017 21:44   Ct Angio Chest Pe W And/or Wo Contrast  Result Date: 12/06/2017 CLINICAL DATA:  47 year old female with shortness of breath. Concern for pulmonary embolism. EXAM: CT ANGIOGRAPHY CHEST WITH CONTRAST TECHNIQUE: Multidetector CT imaging of the chest was performed using the  standard protocol during bolus administration of intravenous contrast. Multiplanar CT image reconstructions and MIPs were obtained to evaluate the vascular anatomy. CONTRAST:  ISOVUE-370 IOPAMIDOL (ISOVUE-370) INJECTION 76% COMPARISON:  Chest radiograph dated 12/05/2017 and CT dated 04/14/2017 FINDINGS: Cardiovascular: There is no cardiomegaly or pericardial effusion. The thoracic aorta is unremarkable. There is a nonocclusive bandlike embolus straddling the bifurcation of the main pulmonary trunk and extending to the central pulmonary arteries consistent with a saddle embolus. There is occlusive thrombus in the lobar branches of the right upper lobe. Nonocclusive clot noted in the lower lobes bilaterally. There is mild enlargement of the right ventricle. The right ventricular diameter is 44.5 mm and the left ventricular diameter is 44 mm with a ratio of approximately 1 consistent with a degree of right heart straining. Mediastinum/Nodes: No hilar or mediastinal adenopathy. The esophagus is grossly unremarkable. There is asymmetric enlargement of the right thyroid lobe. Ultrasound may provide better evaluation on a nonemergent basis. No mediastinal fluid collection. Lungs/Pleura: No focal consolidation, pleural effusion, or pneumothorax. There is a 5 mm right lower lobe nodule (series 9, image 46) similar to prior CT. The central airways are patent. Upper Abdomen: Apparent fatty infiltration of the liver. The visualized upper abdomen is otherwise unremarkable. Musculoskeletal: No chest wall abnormality. No acute or significant osseous findings. Review of the MIP images confirms the above findings. IMPRESSION: 1. Positive for acute PE with CT evidence of right heart strain (RV/LV Ratio = 1.5) consistent with at least submassive (intermediate risk) PE. The presence of right heart strain has been associated with an increased risk of morbidity and mortality. Please activate Code PE by paging [phone]. 2.  Stable 5 mm right lower lobe pulmonary nodule. No follow-up needed if patient is low-risk. Non-contrast chest CT can be considered in 12 months if patient is high-risk. This recommendation follows the consensus statement: Guidelines for Management of Incidental Pulmonary Nodules Detected on CT Images: From the Fleischner Society 2017; Radiology 2017; 284:228-243. These results were called by telephone at the time of interpretation on 12/06/2017 at 2:37 am to Dr. Paula Libra , who verbally acknowledged these results. Electronically Signed   By: Elgie Collard M.D.   On: 12/06/2017 02:40   US Venous Img Lower Bilateral  Result Date: 12/06/2017 CLINICAL DATA:  Bilateral lower extremity pain and swelling left worse than right 4 days. Shortness of breath. EXAM: BILATERAL LOWER EXTREMITY VENOUS DOPPLER ULTRASOUND TECHNIQUE: Gray-scale sonography with graded compression, as well as color Doppler and duplex ultrasound were performed to evaluate the lower extremity deep venous systems from the level of the common femoral vein and including the common femoral, femoral, profunda femoral, popliteal and calf veins including the posterior tibial, peroneal and gastrocnemius veins when visible. The superficial great saphenous vein was also interrogated. Spectral Doppler was utilized to evaluate flow at rest and with  distal augmentation maneuvers in the common femoral, femoral and popliteal veins. COMPARISON:  None. FINDINGS: RIGHT LOWER EXTREMITY Common Femoral Vein: No evidence of thrombus. Normal compressibility, respiratory phasicity and response to augmentation. Saphenofemoral Junction: No evidence of thrombus. Normal compressibility and flow on color Doppler imaging. Profunda Femoral Vein: No evidence of thrombus. Normal compressibility and flow on color Doppler imaging. Femoral Vein: No evidence of thrombus. Normal compressibility, respiratory phasicity and response to augmentation. Popliteal Vein: No evidence of  thrombus. Normal compressibility, respiratory phasicity and response to augmentation. Calf Veins: No evidence of thrombus. Normal compressibility and flow on color Doppler imaging. Superficial Great Saphenous Vein: No evidence of thrombus. Normal compressibility. Venous Reflux:  None. Other Findings:  None. LEFT LOWER EXTREMITY Common Femoral Vein: No evidence of thrombus. Normal compressibility, respiratory phasicity and response to augmentation. Saphenofemoral Junction: No evidence of thrombus. Normal compressibility and flow on color Doppler imaging. Profunda Femoral Vein: No evidence of thrombus. Normal compressibility and flow on color Doppler imaging. Femoral Vein: No evidence of thrombus. Normal compressibility, respiratory phasicity and response to augmentation. Popliteal Vein: Short segment occlusive thrombus which is noncompressible. Calf Veins: No evidence of thrombus. Normal compressibility and flow on color Doppler imaging. Superficial Great Saphenous Vein: No evidence of thrombus. Normal compressibility. Venous Reflux:  None. Other Findings:  Left knee joint effusion. IMPRESSION: Occlusive thrombus left popliteal vein. No deep vein thrombosis within the right lower extremity. Critical Value/emergent results were called by telephone at the time of interpretation on 12/06/2017 at 2:06 am to Dr. Paula Libra , who verbally acknowledged these results. Electronically Signed   By: Elberta Fortis M.D.   On: 12/06/2017 02:06    Time Spent in minutes  30   Susa Raring M.D on 12/09/2017 at 10:46 AM  To page go to [email or web address] - password [email or web address]

## 2017-12-10 LAB — CBC
HEMATOCRIT: 32.3 % — AB (ref 36.0–46.0)
HEMOGLOBIN: 9.3 g/dL — AB (ref 12.0–15.0)
MCH: 23.1 pg — AB (ref 26.0–34.0)
MCHC: 28.8 g/dL — AB (ref 30.0–36.0)
MCV: 80.3 fL (ref 80.0–100.0)
Platelets: 183 10*3/uL (ref 150–400)
RBC: 4.02 MIL/uL (ref 3.87–5.11)
RDW: 18.4 % — AB (ref 11.5–15.5)
WBC: 6.9 10*3/uL (ref 4.0–10.5)
nRBC: 0.4 % — ABNORMAL HIGH (ref 0.0–0.2)

## 2017-12-10 LAB — GLUCOSE, CAPILLARY
GLUCOSE-CAPILLARY: 119 mg/dL — AB (ref 70–99)
GLUCOSE-CAPILLARY: 134 mg/dL — AB (ref 70–99)
Glucose-Capillary: 118 mg/dL — ABNORMAL HIGH (ref 70–99)
Glucose-Capillary: 164 mg/dL — ABNORMAL HIGH (ref 70–99)

## 2017-12-10 LAB — PROTIME-INR
INR: 1.2
Prothrombin Time: 15.1 seconds (ref 11.4–15.2)

## 2017-12-10 MED ORDER — WARFARIN SODIUM 7.5 MG PO TABS
15.0000 mg | ORAL_TABLET | Freq: Once | ORAL | Status: AC
  Administered 2017-12-10: 15 mg via ORAL
  Filled 2017-12-10: qty 2

## 2017-12-10 NOTE — Progress Notes (Signed)
Patient encouraged to walk but still refuses to walk. Informed her that I could provide more pain management if needed but patient said no. Offered to take to the bathroom and then walk but patient would only go to the bathroom. Informed her that going to the bedside commode did not count as walking and she verbalized understanding. Offered bath but patient refused. Will continue to offer patient to walk. Will continue to monitor.

## 2017-12-10 NOTE — Progress Notes (Signed)
PROGRESS NOTE                                                                                                                                                                                                             Patient Demographics:    Holly Rodgers, is a 47 y.o. female, DOB - 1970/09/27, ZOX:[id number]  Admit date - 12/05/2017   Admitting Physician Hillary Bow, DO  Outpatient Primary MD for the patient is Kyung Rudd, MD  LOS - 4  Chief Complaint  Patient presents with  . Shortness of Breath       Brief Narrative    Holly Rodgers a 47 year old African-American female, morbidly obese, with past medical history significant for hypertension, diabetes mellitus, depression, anxiety and chronic pain. Patient presented with shortness of breath, dyspnea on minimal exertion, bilateral lower extremity pain and swelling, worse on the left side. CT Angie of the chest done on presentation was"Positive for acute PE with CT evidence of right heart strain (RV/LV Ratio = 1.5) consistent with at least submassive (intermediate risk) PE. The presence of right heart strain has been associated with an increased risk of morbidity and mortality. Stable 5 mm right lower lobe pulmonary nodule".Vascular ultrasound of the lower extremities revealed "Occlusive thrombus left popliteal vein".Patient is currently on heparin drip. Patient's vital signs are stable. Patient is on room air. According to the patient, due to pain, patient was minimally active at home.     Subjective:   Patient in bed, appears comfortable, denies any headache, no fever, no chest pain or pressure, no shortness of breath , no abdominal pain. No focal weakness. Mild Left leg pain.   Assessment  & Plan :    Acute saddle PE without acute cor pulmonale - along with positive left leg DVT, was on birth control, history of smoking and morbidly obese.  Birth control held, initially kept on IV  heparin, echocardiogram satisfactory with no suggestion of right heart strain, pulmonary artery pressure 34 with only mild elevation.  Hemodynamically stable and at rest on room air. She was DC'd home however according to PT she needs to go to SNF due to deconditioning which is rather surprising, await bed encouraged to increase activity continue PT .  HTN - Continue Cardizem and Toprol XL, Continue to optimize  Pulmonary nodule   -Incidental finding, She has a 5-year smoking history and so is at relatively low risk, one-time outpatient pulmonary follow-up for monitoring and follow-up  Super morbid obesity -  Diet and exercise, Needs to loose weight. Follow with PCP for the same.  DM2 - diet controlled.  Follow with PCP.  Generalized weakness and deconditioning.  And has immense lack of motivation to ambulate or even stand up, counseled, refusing to walk with nursing staff, seen by PT and for now SNF is recommended.  Patient states she does not get good help at home from her boyfriend and she would prefer personally going to SNF however I think with motivation her ambulatory status might remarkably improved.   Family Communication  :  None  Code Status :  Full  Disposition Plan  :  Home in 1-2 days VS SNF  Consults  :  PCCM and IR  Procedures  :    Leg [place] - Occlusive thrombus left popliteal vein. No deep vein thrombosis within the right lower extremity.   TTE  - Left ventricle: The cavity size was normal. Wall thickness wasnormal. Systolic function was vigorous. The estimated ejectionfraction was in the range of 65% to 70%. Wall motion was normal;there were no regional wall motion abnormalities. Leftventricular diastolic function parameters were normal for thepatient&'s age. - Right atrium: The atrium was mildly dilated. - Pulmonary arteries: Systolic pressure was mildly increased. PApeak pressure: 34 mm Hg (S).   DVT Prophylaxis  :  Lovenox/Coumadin  Lab Results    Component Value Date   PLT 183 12/10/2017   Lab Results  Component Value Date   INR 1.20 12/10/2017   INR 1.14 12/09/2017   INR 1.00 12/08/2017    Diet :  Diet Order            Diet - low sodium heart healthy        Diet regular Room service appropriate? Yes; Fluid consistency: Thin  Diet effective now               Inpatient Medications Scheduled Meds: . diltiazem  300 mg Oral Daily  . docusate sodium  100 mg Oral BID  . enoxaparin (LOVENOX) injection  160 mg Subcutaneous Q12H  . gabapentin  600 mg Oral TID  . Influenza vac split quadrivalent PF  0.5 mL Intramuscular Tomorrow-1000  . insulin aspart  0-20 Units Subcutaneous TID WC  . metoprolol succinate  50 mg Oral Daily  . pneumococcal 23 valent vaccine  0.5 mL Intramuscular Tomorrow-1000  . sertraline  100 mg Oral Daily  . sodium chloride flush  3 mL Intravenous Q12H  . warfarin   Does not apply Once  . Warfarin - Pharmacist Dosing Inpatient   Does not apply q1800   Continuous Infusions: . cefTRIAXone (ROCEPHIN)  IV Stopped (12/10/17 0616)   PRN Meds:.acetaminophen **OR** [DISCONTINUED] acetaminophen, diphenhydrAMINE, HYDROmorphone (DILAUDID) injection, [DISCONTINUED] ondansetron **OR** ondansetron (ZOFRAN) IV, tiZANidine  Antibiotics  :   Anti-infectives (From admission, onward)   Start     Dose/Rate Route Frequency Ordered Stop   12/09/17 0630  cefTRIAXone (ROCEPHIN) 1 g in sodium chloride 0.9 % 100 mL IVPB     1 g 200 mL/hr over 30 Minutes Intravenous Every 24 hours 12/09/17 0628 12/12/17 0629          Objective:   Vitals:   12/10/17 0008 12/10/17 0447 12/10/17 0700 12/10/17 0800  BP:  123/75    Pulse:      Resp:  17 (!) 21 (!) 21  Temp: 98.3 F (36.8 C) 98.3 F (36.8 C)    TempSrc: Oral Oral    SpO2:      Weight:  Height:        Wt Readings from Last 3 Encounters:  12/05/17 (!) 190.5 kg  08/19/10 108.9 kg     Intake/Output Summary (Last 24 hours) at 12/10/2017 0901 Last data  filed at 12/10/2017 0800 Gross per 24 hour  Intake 202.22 ml  Output 1300 ml  Net -1097.78 ml     Physical Exam  Awake Alert, Oriented X 3, No new F.N deficits, Normal affect [place].AT,PERRAL Supple Neck,No JVD, No cervical lymphadenopathy appriciated.  Symmetrical Chest wall movement, Good air movement bilaterally, CTAB RRR,No Gallops, Rubs or new Murmurs, No Parasternal Heave +ve B.Sounds, Abd Soft, No tenderness, No organomegaly appriciated, No rebound - guarding or rigidity. No Cyanosis, Clubbing or edema,  L.Leg 1+ swelling   Data Review:    CBC Recent Labs  Lab 12/06/17 0010 12/07/17 0305 12/08/17 0220 12/09/17 0233 12/10/17 0219  WBC 10.2 8.7 8.3 7.3 6.9  HGB 10.8* 9.6* 9.3* 9.2* 9.3*  HCT 35.8* 33.6* 32.8* 31.9* 32.3*  PLT 215 209 186 180 183  MCV 79.2* 81.4 81.4 80.8 80.3  MCH 23.9* 23.2* 23.1* 23.3* 23.1*  MCHC 30.2 28.6* 28.4* 28.8* 28.8*  RDW 18.6* 18.6* 18.5* 18.6* 18.4*  LYMPHSABS 1.8  --   --   --   --   MONOABS 0.6  --   --   --   --   EOSABS 0.1  --   --   --   --   BASOSABS 0.0  --   --   --   --     Chemistries  Recent Labs  Lab 12/06/17 0010 12/07/17 0305 12/08/17 0719  NA 136 135 134*  K 3.6 3.4* 3.6  CL 101 103 102  CO2 25 26 26   GLUCOSE 123* 170* 112*  BUN 14 12 12   CREATININE 0.87 1.27* 0.90  CALCIUM 9.1 8.8* 9.1  MG  --   --  2.0   ------------------------------------------------------------------------------------------------------------------ No results for input(s): CHOL, HDL, LDLCALC, TRIG, CHOLHDL, LDLDIRECT in the last 72 hours.  Lab Results  Component Value Date   HGBA1C 7.4 (H) 12/06/2017   ------------------------------------------------------------------------------------------------------------------ No results for input(s): TSH, T4TOTAL, T3FREE, THYROIDAB in the last 72 hours.  Invalid input(s):  FREET3 ------------------------------------------------------------------------------------------------------------------ No results for input(s): VITAMINB12, FOLATE, FERRITIN, TIBC, IRON, RETICCTPCT in the last 72 hours.  Coagulation profile Recent Labs  Lab 12/08/17 0220 12/09/17 0233 12/10/17 0219  INR 1.00 1.14 1.20    No results for input(s): DDIMER in the last 72 hours.  Cardiac Enzymes Recent Labs  Lab 12/06/17 1035 12/06/17 1524 12/06/17 2109  TROPONINI <0.03 <0.03 0.03*   ------------------------------------------------------------------------------------------------------------------    Component Value Date/Time   BNP 122.3 (H) 12/06/2017 0010    Micro Results Recent Results (from the past 240 hour(s))  Culture, Urine     Status: Abnormal   Collection Time: 12/07/17  8:14 AM  Result Value Ref Range Status   Specimen Description URINE, RANDOM  Final   Special Requests   Final    Normal Performed at [facility], [address], [place], [place] [address]    Culture >=100,000 COLONIES/mL ESCHERICHIA COLI (A)  Final   Report Status 12/09/2017 FINAL  Final   Organism ID, Bacteria ESCHERICHIA COLI (A)  Final      Susceptibility   Escherichia coli - MIC*    AMPICILLIN <=2 SENSITIVE Sensitive     CEFAZOLIN <=4 SENSITIVE Sensitive     CEFTRIAXONE <=1 SENSITIVE Sensitive     CIPROFLOXACIN <=0.25 SENSITIVE  Sensitive     GENTAMICIN <=1 SENSITIVE Sensitive     IMIPENEM <=0.25 SENSITIVE Sensitive     NITROFURANTOIN <=16 SENSITIVE Sensitive     TRIMETH/SULFA <=20 SENSITIVE Sensitive     AMPICILLIN/SULBACTAM <=2 SENSITIVE Sensitive     PIP/TAZO <=4 SENSITIVE Sensitive     Extended ESBL NEGATIVE Sensitive     * >=100,000 COLONIES/mL ESCHERICHIA COLI    Radiology Reports Dg Chest 2 View  Result Date: 12/05/2017 CLINICAL DATA:  Shortness of breath and dizziness EXAM: CHEST - 2 VIEW COMPARISON:  September 18, 2017 FINDINGS: The heart size and mediastinal  contours are within normal limits. Both lungs are clear. The visualized skeletal structures are unremarkable. IMPRESSION: No active cardiopulmonary disease. Electronically Signed   By: Sherian Rein M.D.   On: 12/05/2017 21:44   Ct Angio Chest Pe W And/or Wo Contrast  Result Date: 12/06/2017 CLINICAL DATA:  47 year old female with shortness of breath. Concern for pulmonary embolism. EXAM: CT ANGIOGRAPHY CHEST WITH CONTRAST TECHNIQUE: Multidetector CT imaging of the chest was performed using the standard protocol during bolus administration of intravenous contrast. Multiplanar CT image reconstructions and MIPs were obtained to evaluate the vascular anatomy. CONTRAST:  ISOVUE-370 IOPAMIDOL (ISOVUE-370) INJECTION 76% COMPARISON:  Chest radiograph dated 12/05/2017 and CT dated 04/14/2017 FINDINGS: Cardiovascular: There is no cardiomegaly or pericardial effusion. The thoracic aorta is unremarkable. There is a nonocclusive bandlike embolus straddling the bifurcation of the main pulmonary trunk and extending to the central pulmonary arteries consistent with a saddle embolus. There is occlusive thrombus in the lobar branches of the right upper lobe. Nonocclusive clot noted in the lower lobes bilaterally. There is mild enlargement of the right ventricle. The right ventricular diameter is 44.5 mm and the left ventricular diameter is 44 mm with a ratio of approximately 1 consistent with a degree of right heart straining. Mediastinum/Nodes: No hilar or mediastinal adenopathy. The esophagus is grossly unremarkable. There is asymmetric enlargement of the right thyroid lobe. Ultrasound may provide better evaluation on a nonemergent basis. No mediastinal fluid collection. Lungs/Pleura: No focal consolidation, pleural effusion, or pneumothorax. There is a 5 mm right lower lobe nodule (series 9, image 46) similar to prior CT. The central airways are patent. Upper Abdomen: Apparent fatty infiltration of the liver. The  visualized upper abdomen is otherwise unremarkable. Musculoskeletal: No chest wall abnormality. No acute or significant osseous findings. Review of the MIP images confirms the above findings. IMPRESSION: 1. Positive for acute PE with CT evidence of right heart strain (RV/LV Ratio = 1.5) consistent with at least submassive (intermediate risk) PE. The presence of right heart strain has been associated with an increased risk of morbidity and mortality. Please activate Code PE by paging [phone]. 2. Stable 5 mm right lower lobe pulmonary nodule. No follow-up needed if patient is low-risk. Non-contrast chest CT can be considered in 12 months if patient is high-risk. This recommendation follows the consensus statement: Guidelines for Management of Incidental Pulmonary Nodules Detected on CT Images: From the Fleischner Society 2017; Radiology 2017; 284:228-243. These results were called by telephone at the time of interpretation on 12/06/2017 at 2:37 am to Dr. Paula Libra , who verbally acknowledged these results. Electronically Signed   By: Elgie Collard M.D.   On: 12/06/2017 02:40   US Venous Img Lower Bilateral  Result Date: 12/06/2017 CLINICAL DATA:  Bilateral lower extremity pain and swelling left worse than right 4 days. Shortness of breath. EXAM: BILATERAL LOWER EXTREMITY VENOUS DOPPLER ULTRASOUND TECHNIQUE: Gray-scale sonography  with graded compression, as well as color Doppler and duplex ultrasound were performed to evaluate the lower extremity deep venous systems from the level of the common femoral vein and including the common femoral, femoral, profunda femoral, popliteal and calf veins including the posterior tibial, peroneal and gastrocnemius veins when visible. The superficial great saphenous vein was also interrogated. Spectral Doppler was utilized to evaluate flow at rest and with distal augmentation maneuvers in the common femoral, femoral and popliteal veins. COMPARISON:  None. FINDINGS:  RIGHT LOWER EXTREMITY Common Femoral Vein: No evidence of thrombus. Normal compressibility, respiratory phasicity and response to augmentation. Saphenofemoral Junction: No evidence of thrombus. Normal compressibility and flow on color Doppler imaging. Profunda Femoral Vein: No evidence of thrombus. Normal compressibility and flow on color Doppler imaging. Femoral Vein: No evidence of thrombus. Normal compressibility, respiratory phasicity and response to augmentation. Popliteal Vein: No evidence of thrombus. Normal compressibility, respiratory phasicity and response to augmentation. Calf Veins: No evidence of thrombus. Normal compressibility and flow on color Doppler imaging. Superficial Great Saphenous Vein: No evidence of thrombus. Normal compressibility. Venous Reflux:  None. Other Findings:  None. LEFT LOWER EXTREMITY Common Femoral Vein: No evidence of thrombus. Normal compressibility, respiratory phasicity and response to augmentation. Saphenofemoral Junction: No evidence of thrombus. Normal compressibility and flow on color Doppler imaging. Profunda Femoral Vein: No evidence of thrombus. Normal compressibility and flow on color Doppler imaging. Femoral Vein: No evidence of thrombus. Normal compressibility, respiratory phasicity and response to augmentation. Popliteal Vein: Short segment occlusive thrombus which is noncompressible. Calf Veins: No evidence of thrombus. Normal compressibility and flow on color Doppler imaging. Superficial Great Saphenous Vein: No evidence of thrombus. Normal compressibility. Venous Reflux:  None. Other Findings:  Left knee joint effusion. IMPRESSION: Occlusive thrombus left popliteal vein. No deep vein thrombosis within the right lower extremity. Critical Value/emergent results were called by telephone at the time of interpretation on 12/06/2017 at 2:06 am to Dr. Paula Libra , who verbally acknowledged these results. Electronically Signed   By: Elberta Fortis M.D.   On:  12/06/2017 02:06    Time Spent in minutes  30   Susa Raring M.D on 12/10/2017 at 9:01 AM  To page go to [email or web address] - password [email or web address]

## 2017-12-10 NOTE — Progress Notes (Signed)
Patient agreed to walk with staff. Walked 100 ft total with one break in between. Tolerated well. Front walker used. Patient also agreed to a bath with staff. Will continue to monitor.

## 2017-12-10 NOTE — Progress Notes (Signed)
ANTICOAGULATION CONSULT NOTE - Follow Up Consult  Pharmacy Consult for heparin >> enox + warfarin Indication: pulmonary embolus  Labs: Recent Labs    12/08/17 0220 12/08/17 0719 12/09/17 0233 12/09/17 1220 12/10/17 0219  HGB 9.3*  --  9.2*  --  9.3*  HCT 32.8*  --  31.9*  --  32.3*  PLT 186  --  180  --  183  LABPROT 13.1  --  14.5  --  15.1  INR 1.00  --  1.14  --  1.20  HEPARINUNFRC 0.14*  --   --  1.26*  --   CREATININE  --  0.90  --   --   --     Assessment: 47yo female on heparin infusion for saddle PE.  Given weight>120 kg (wt documented today at 190 kg), will start enoxaparin bridge with warfarin. The ISTH 2016 guidelines suggest avoiding using apixaban due to lack of clinical data in obese populations. Hgb 9.3, plt 183. No s/sx of bleeding reported by nursing.  INR remains subtherapeutic at 1.2 s/p warfarin 10 mg x2.    Goal of Therapy:  Enoxaparin reference range: 0.6 to 1 units/mL INR goal: 2-3   Plan:  Continue enoxaparin to 160 mg [place] q12h Warfarin 15 mg x1 Monitor daily INR, CBC, and for medication interactions Recommend rechecking enoxaparin level drawn 4-6 hours after 3rd or 4th dose of new regimen    Marcelino Freestone, PharmD PGY2 Cardiology Pharmacy Resident Phone [phone] Please check AMION for all Pharmacist numbers by unit 12/10/2017 12:13 PM

## 2017-12-10 NOTE — Plan of Care (Signed)

## 2017-12-10 NOTE — Progress Notes (Signed)
Offered bath and walking to patient. Patient refused again. Offered by myself and Probation officer. Patient refused both by both staff members. Photographer of situation. Charge nurse went to talk to patient about plan of care. Will continue to monitor.

## 2017-12-11 LAB — GLUCOSE, CAPILLARY
Glucose-Capillary: 122 mg/dL — ABNORMAL HIGH (ref 70–99)
Glucose-Capillary: 92 mg/dL (ref 70–99)

## 2017-12-11 LAB — CBC
HCT: 34.9 % — ABNORMAL LOW (ref 36.0–46.0)
Hemoglobin: 10.1 g/dL — ABNORMAL LOW (ref 12.0–15.0)
MCH: 23.1 pg — ABNORMAL LOW (ref 26.0–34.0)
MCHC: 28.9 g/dL — ABNORMAL LOW (ref 30.0–36.0)
MCV: 79.7 fL — AB (ref 80.0–100.0)
NRBC: 0.5 % — AB (ref 0.0–0.2)
PLATELETS: 219 10*3/uL (ref 150–400)
RBC: 4.38 MIL/uL (ref 3.87–5.11)
RDW: 18.6 % — ABNORMAL HIGH (ref 11.5–15.5)
WBC: 6.2 10*3/uL (ref 4.0–10.5)

## 2017-12-11 LAB — PROTIME-INR
INR: 1.28
PROTHROMBIN TIME: 15.8 s — AB (ref 11.4–15.2)

## 2017-12-11 MED ORDER — ENOXAPARIN SODIUM 300 MG/3ML IJ SOLN: 160.0000 mg | Freq: Two times a day (BID) | INTRAMUSCULAR | 0 refills | Status: DC

## 2017-12-11 MED ORDER — COUMADIN BOOK: 1.0000 | Freq: Once | 0 refills | Status: AC

## 2017-12-11 MED ORDER — WARFARIN SODIUM 7.5 MG PO TABS: 7.5000 mg | ORAL_TABLET | Freq: Once | ORAL | 0 refills | Status: DC

## 2017-12-11 MED ORDER — WARFARIN SODIUM 7.5 MG PO TABS
7.5000 mg | ORAL_TABLET | Freq: Once | ORAL | Status: AC
  Administered 2017-12-11: 7.5 mg via ORAL
  Filled 2017-12-11: qty 1

## 2017-12-11 MED FILL — WARFARIN SODIUM 7.5 MG TAB: 7.5 | 7 days supply | Qty: 7 | Fill #0

## 2017-12-11 MED FILL — ENOXAPARIN 80 MG/0.8 ML SYR: 80 | 7 days supply | Qty: 22 | Fill #0

## 2017-12-11 NOTE — Progress Notes (Signed)
Managed to give lovenox injection to right abd . with good technique. Receptive to teachings.

## 2017-12-11 NOTE — Clinical Social Work Note (Addendum)
Patient has insurance approval to discharge to SNF today.  Charlynn Court, CSW [phone]  12:07 pm Patient has changed her mind and decided to discharge home today. SNF aware.  CSW signing off.  Charlynn Court, CSW [phone]

## 2017-12-11 NOTE — Progress Notes (Signed)
ANTICOAGULATION CONSULT NOTE - Follow Up Consult  Pharmacy Consult for heparin >> enox + warfarin Indication: pulmonary embolus  Labs: Recent Labs    12/09/17 0233 12/09/17 1220 12/10/17 0219 12/11/17 0215  HGB 9.2*  --  9.3* 10.1*  HCT 31.9*  --  32.3* 34.9*  PLT 180  --  183 219  LABPROT 14.5  --  15.1 15.8*  INR 1.14  --  1.20 1.28  HEPARINUNFRC  --  1.26*  --   --     Assessment: 47yo female on heparin infusion for saddle PE transitioned to LMWH and warfarin given weight. Enoxaparin initially dosed at 1mg /kg BID appropriately, anti-Xa level elevated so dose reduced. INR remains subtherapeutic at 1.28 today despite boosted doses. Noted plans for D/C patient to SNF today,   Goal of Therapy:  Enoxaparin reference range: 0.6 to 1 units/mL INR goal: 2-3   Plan:  -Continue enoxaparin 160mg  SQ BID -Would give warfarin 7.5mg  daily with INR check in 3 days  Fredonia Highland, PharmD, BCPS Clinical Pharmacist [phone] Please check AMION for all [facility] Pharmacy numbers 12/11/2017

## 2017-12-11 NOTE — Progress Notes (Signed)
Discharged home  By wheelchair, discharge instructions, walker , meds from pharmacy and belongings taken home.

## 2017-12-11 NOTE — Progress Notes (Signed)
Pt has discharge home order, claimed that her ride will becoming at 5 pm MD aware, lovenox and coumadin provided by pharmacy at bedside.

## 2017-12-11 NOTE — Care Management Note (Addendum)
Case Management Note  Patient Details  Name: Holly Rodgers MRN: [id number] Date of Birth: 1970/06/26  Subjective/Objective:     Pt admitted with Saddle PE               Action/Plan:  PTA independent from home with SO - Pt informed CM that her SO will provide all required help at discharge.  Pt is now refusing SNF and plans to discharge home with [facility].  CM offered pt choice for [facility] and DME - pt chose [facility] - Agency contacted and referral accepted.  Unit secretary established an appt with pts PCP Dr Holly Rodgers in [facility] 11/20 (earliest appt available).  [facility] confirmed they will perform INR checks as ordered and follow up with PCP above - CM communicated correct PCP name and follow up information; Holly Rodgers [phone].  Attending made aware of plan above and is in agreement with [facility] performing INR draws and communicating it to PCP.   Expected Discharge Date:  12/11/17               Expected Discharge Plan:  Skilled Nursing Facility  In-House Referral:  Clinical Social Work  Discharge planning Services  CM Consult  Post Acute Care Choice:    Choice offered to:  Patient  DME Arranged:    DME Agency:     HH Arranged:  RN, PT HH Agency:  [facility]  Status of Service:  Completed, signed off  If discussed at Long Length of Stay Meetings, dates discussed:    Additional Comments: CM also spoke with Holly Rodgers at PCP office and informed office that pt will discharge home on Lovenox and coumadin, [facility] will follow up with them post INR draws with the expectation that the office will then reach out to the pt for adjustments with these two medications.   TOC pharmacy will provide pt discharge medications to bedside before he leaves facility Holly Parr, RN 12/11/2017, 10:31 AM

## 2018-01-21 ENCOUNTER — Other Ambulatory Visit: Payer: Self-pay

## 2018-01-21 ENCOUNTER — Emergency Department (HOSPITAL_COMMUNITY): Payer: 59

## 2018-01-21 ENCOUNTER — Encounter (HOSPITAL_COMMUNITY): Payer: Self-pay | Admitting: Emergency Medicine

## 2018-01-21 ENCOUNTER — Observation Stay (HOSPITAL_COMMUNITY)
Admission: EM | Admit: 2018-01-21 | Discharge: 2018-01-25 | Disposition: A | Discharge status: Skilled Nursing Facility | Payer: 59 | Attending: Family Medicine | Admitting: Family Medicine

## 2018-01-21 ENCOUNTER — Emergency Department (HOSPITAL_BASED_OUTPATIENT_CLINIC_OR_DEPARTMENT_OTHER): Payer: 59

## 2018-01-21 DIAGNOSIS — I82402 Acute embolism and thrombosis of unspecified deep veins of left lower extremity: Secondary | ICD-10-CM | POA: Diagnosis not present

## 2018-01-21 DIAGNOSIS — Z87891 Personal history of nicotine dependence: Secondary | ICD-10-CM | POA: Diagnosis not present

## 2018-01-21 DIAGNOSIS — I2699 Other pulmonary embolism without acute cor pulmonale: Secondary | ICD-10-CM | POA: Diagnosis present

## 2018-01-21 DIAGNOSIS — Z6841 Body Mass Index (BMI) 40.0 and over, adult: Secondary | ICD-10-CM | POA: Insufficient documentation

## 2018-01-21 DIAGNOSIS — M79609 Pain in unspecified limb: Secondary | ICD-10-CM

## 2018-01-21 DIAGNOSIS — I82462 Acute embolism and thrombosis of left calf muscular vein: Secondary | ICD-10-CM

## 2018-01-21 DIAGNOSIS — K219 Gastro-esophageal reflux disease without esophagitis: Secondary | ICD-10-CM | POA: Diagnosis not present

## 2018-01-21 DIAGNOSIS — E119 Type 2 diabetes mellitus without complications: Secondary | ICD-10-CM | POA: Diagnosis not present

## 2018-01-21 DIAGNOSIS — F419 Anxiety disorder, unspecified: Secondary | ICD-10-CM | POA: Insufficient documentation

## 2018-01-21 DIAGNOSIS — Z7901 Long term (current) use of anticoagulants: Secondary | ICD-10-CM | POA: Insufficient documentation

## 2018-01-21 DIAGNOSIS — M79604 Pain in right leg: Secondary | ICD-10-CM

## 2018-01-21 DIAGNOSIS — Z79899 Other long term (current) drug therapy: Secondary | ICD-10-CM | POA: Diagnosis not present

## 2018-01-21 DIAGNOSIS — Z86718 Personal history of other venous thrombosis and embolism: Secondary | ICD-10-CM

## 2018-01-21 DIAGNOSIS — Z86711 Personal history of pulmonary embolism: Secondary | ICD-10-CM

## 2018-01-21 DIAGNOSIS — M79605 Pain in left leg: Secondary | ICD-10-CM

## 2018-01-21 DIAGNOSIS — R079 Chest pain, unspecified: Secondary | ICD-10-CM

## 2018-01-21 DIAGNOSIS — G894 Chronic pain syndrome: Secondary | ICD-10-CM | POA: Diagnosis not present

## 2018-01-21 DIAGNOSIS — R531 Weakness: Secondary | ICD-10-CM | POA: Insufficient documentation

## 2018-01-21 DIAGNOSIS — R0602 Shortness of breath: Secondary | ICD-10-CM | POA: Diagnosis present

## 2018-01-21 DIAGNOSIS — D509 Iron deficiency anemia, unspecified: Secondary | ICD-10-CM | POA: Diagnosis not present

## 2018-01-21 DIAGNOSIS — R0789 Other chest pain: Secondary | ICD-10-CM | POA: Diagnosis present

## 2018-01-21 DIAGNOSIS — I10 Essential (primary) hypertension: Secondary | ICD-10-CM | POA: Insufficient documentation

## 2018-01-21 DIAGNOSIS — R911 Solitary pulmonary nodule: Secondary | ICD-10-CM | POA: Insufficient documentation

## 2018-01-21 LAB — CBC
HCT: 35.2 % — ABNORMAL LOW (ref 36.0–46.0)
Hemoglobin: 9.8 g/dL — ABNORMAL LOW (ref 12.0–15.0)
MCH: 22.1 pg — ABNORMAL LOW (ref 26.0–34.0)
MCHC: 27.8 g/dL — AB (ref 30.0–36.0)
MCV: 79.3 fL — ABNORMAL LOW (ref 80.0–100.0)
NRBC: 0 % (ref 0.0–0.2)
PLATELETS: 344 10*3/uL (ref 150–400)
RBC: 4.44 MIL/uL (ref 3.87–5.11)
RDW: 19 % — AB (ref 11.5–15.5)
WBC: 7 10*3/uL (ref 4.0–10.5)

## 2018-01-21 LAB — I-STAT TROPONIN, ED: Troponin i, poc: 0 ng/mL (ref 0.00–0.08)

## 2018-01-21 LAB — BASIC METABOLIC PANEL
Anion gap: 12 (ref 5–15)
BUN: 9 mg/dL (ref 6–20)
CALCIUM: 9.1 mg/dL (ref 8.9–10.3)
CO2: 29 mmol/L (ref 22–32)
Chloride: 99 mmol/L (ref 98–111)
Creatinine, Ser: 0.77 mg/dL (ref 0.44–1.00)
GFR calc non Af Amer: >60 mL/min (ref >60)
Glucose, Bld: 110 mg/dL — ABNORMAL HIGH (ref 70–99)
Potassium: 3.8 mmol/L (ref 3.5–5.1)
SODIUM: 140 mmol/L (ref 135–145)

## 2018-01-21 LAB — PROTIME-INR
INR: 1.04
PROTHROMBIN TIME: 13.5 s (ref 11.4–15.2)

## 2018-01-21 MED ORDER — IOPAMIDOL (ISOVUE-370) INJECTION 76%
100.0000 mL | Freq: Once | INTRAVENOUS | Status: AC | PRN
  Administered 2018-01-21: 100 mL via INTRAVENOUS

## 2018-01-21 MED ORDER — SERTRALINE HCL 100 MG PO TABS
100.0000 mg | ORAL_TABLET | Freq: Every day | ORAL | Status: DC
  Administered 2018-01-21 – 2018-01-25 (×5): 100 mg via ORAL
  Filled 2018-01-21 (×6): qty 1

## 2018-01-21 MED ORDER — ENOXAPARIN SODIUM 300 MG/3ML IJ SOLN
190.0000 mg | Freq: Two times a day (BID) | INTRAMUSCULAR | Status: DC
  Administered 2018-01-21 – 2018-01-25 (×7): 190 mg via SUBCUTANEOUS
  Filled 2018-01-21 (×10): qty 1.9

## 2018-01-21 MED ORDER — METOPROLOL SUCCINATE ER 50 MG PO TB24
50.0000 mg | ORAL_TABLET | Freq: Every day | ORAL | Status: DC
  Administered 2018-01-22 – 2018-01-25 (×4): 50 mg via ORAL
  Filled 2018-01-21 (×4): qty 1

## 2018-01-21 MED ORDER — HYDROMORPHONE HCL 1 MG/ML IJ SOLN
0.5000 mg | Freq: Once | INTRAMUSCULAR | Status: AC
  Administered 2018-01-21: 0.5 mg via INTRAVENOUS
  Filled 2018-01-21: qty 1

## 2018-01-21 MED ORDER — SODIUM CHLORIDE 0.9% FLUSH
3.0000 mL | Freq: Two times a day (BID) | INTRAVENOUS | Status: DC
  Administered 2018-01-21 – 2018-01-25 (×8): 3 mL via INTRAVENOUS

## 2018-01-21 MED ORDER — CYCLOBENZAPRINE HCL 10 MG PO TABS
10.0000 mg | ORAL_TABLET | Freq: Once | ORAL | Status: AC
  Administered 2018-01-21: 10 mg via ORAL
  Filled 2018-01-21: qty 1

## 2018-01-21 MED ORDER — VITAMIN D3 50 MCG (2000 UT) PO CAPS: 2000.0000 [IU] | ORAL_CAPSULE | Freq: Every day | ORAL | Status: DC

## 2018-01-21 MED ORDER — PANTOPRAZOLE SODIUM 40 MG PO TBEC
40.0000 mg | DELAYED_RELEASE_TABLET | Freq: Every day | ORAL | Status: DC
  Administered 2018-01-21 – 2018-01-25 (×5): 40 mg via ORAL
  Filled 2018-01-21 (×5): qty 1

## 2018-01-21 MED ORDER — ACETAMINOPHEN 500 MG PO TABS
1000.0000 mg | ORAL_TABLET | Freq: Three times a day (TID) | ORAL | Status: DC
  Administered 2018-01-21 – 2018-01-24 (×9): 1000 mg via ORAL
  Filled 2018-01-21 (×11): qty 2

## 2018-01-21 MED ORDER — POLYETHYLENE GLYCOL 3350 17 G PO PACK: 17.0000 g | PACK | Freq: Every day | ORAL | Status: DC | PRN

## 2018-01-21 MED ORDER — OXYCODONE HCL 5 MG PO TABS
5.0000 mg | ORAL_TABLET | ORAL | Status: DC | PRN
  Administered 2018-01-21 – 2018-01-24 (×5): 5 mg via ORAL
  Filled 2018-01-21 (×5): qty 1

## 2018-01-21 MED ORDER — ONDANSETRON 4 MG PO TBDP
4.0000 mg | ORAL_TABLET | Freq: Three times a day (TID) | ORAL | Status: DC | PRN
  Administered 2018-01-21 – 2018-01-25 (×6): 4 mg via ORAL
  Filled 2018-01-21 (×6): qty 1

## 2018-01-21 MED ORDER — MORPHINE SULFATE (PF) 4 MG/ML IV SOLN
4.0000 mg | Freq: Once | INTRAVENOUS | Status: AC
  Administered 2018-01-21: 4 mg via INTRAVENOUS
  Filled 2018-01-21: qty 1

## 2018-01-21 MED ORDER — DILTIAZEM HCL ER COATED BEADS 180 MG PO CP24
300.0000 mg | ORAL_CAPSULE | Freq: Every day | ORAL | Status: DC
  Administered 2018-01-22 – 2018-01-25 (×4): 300 mg via ORAL
  Filled 2018-01-21 (×4): qty 1

## 2018-01-21 MED ORDER — IOPAMIDOL (ISOVUE-370) INJECTION 76%
INTRAVENOUS | Status: AC
  Administered 2018-01-21: 16:00:00
  Filled 2018-01-21: qty 100

## 2018-01-21 NOTE — ED Notes (Signed)
Code 1976 given to patients cousin Luvenia ReddenLeslie Hayes per patient request.  Per patient okay to give her info over the phone with this code.

## 2018-01-21 NOTE — ED Triage Notes (Signed)
Per [facility]- pt picked up from home reporting bilateral leg pain and SOBx2 days. Pt has hx of DVT and PE. Recently hospitalized Aug- Nov.  Pt reports she ran out of coumadin x8days.

## 2018-01-21 NOTE — H&P (Signed)
History and Physical   Holly Rodgers QQV:[id number] DOB: 1970-07-02 DOA: 01/21/2018  PCP: Kyung Rudd, MD  Chief Complaint: Generalized pain   HPI: This is a 47 year old woman with medical problems including morbid obesity, not insulin-dependent diet-controlled diabetes, hypertension, anxiety, chronic pain syndrome, and recently diagnosed submassive pulmonary embolus during hospitalization from December 05, 2017 through December 11, 2017, presenting with generalized pain.  History is obtained via patient report as well as emergency medicine report, as well as chart review.  The patient is accompanied by 2 female companions that she describes both as her cousin as well as her sisters.  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  She denies current smoking or alcohol use.  She reports that she has required assistance with bathing due to her chronic pain and body habitus over the past few months.  Since being discharged last admission, she used low molecular weight heparin injections as well as started on warfarin therapy.  However, her primary care physician attempted to transition her oral anticoagulation management to a pharmacy lead program.  However, the patient due to her immobility and transportation challenges could not make it to the outpatient pharmacy warfarin management clinic.  Therefore, she has not taken any Coumadin due to lack of prescription for the past 8 days.  She reports she tolerated the low molecular weight injections without any difficulty, she denies any blood loss specifically vaginal bleeding or blood per rectum.  She reports this was her first blood clot.  Of note, prior hospital admission discovered submassive pulmonary embolus, as well as left lower extremity DVT (left popliteal vein).  Other symptoms she reports includes worsening generalized pain in  her arms and legs, as well as her abdomen.  Poorly described, minimally improved with as needed Tylenol and twice daily pregabalin.  She also reports not thinking straight.  She reports difficulty swallowing solids and liquids, as well as sensation of fullness in her neck.  No reported hemoptysis, seizures, shortness of breath, chest pain, syncope.  She called EMS due to worsening pain not alleviated with her outpatient regimen.  She also reports chronic back pain.  ED Course: In the emergency department vital signs remarkable for respiratory rate between 20 and 25, otherwise relatively unremarkable with a systolic blood pressure in the 140s.  Lab diagnostics remarkable for INR of 1.05, normal white blood cell count, hemoglobin 9.8 with an MCV of 79.3.  BMP within normal limits with exception of glucose of 110.  Troponin within normal limits.  EKG is sinus rhythm, no ST segment elevation.  CT angiogram performed did not reveal any central pulmonary embolus, there was areas of hypoattenuation in multiple segmental branches which may indicate residual embolus, mild enlarged main pulmonary artery.  Acute DVT located in the left popliteal vein via ultrasound of bilateral lower extremities.  Patient was given IV hydromorphone as well as IV morphine and muscle relaxant for symptomatic pain control.  Hospital medicine consulted for further management.  Review of Systems: A complete ROS was obtained; pertinent positives negatives are denoted in the HPI. Otherwise, all systems are negative.   Past Medical History:  Diagnosis Date  . Anxiety   . Chronic pain   . Cyst   . Depression   . Diabetes mellitus without complication (HCC)   . Hearing loss   . Hypertension   . Idiopathic dilatation of pulmonary artery (HCC)   .  Insomnia   . Snoring   . Tinnitus   . Vertigo    Social History   Socioeconomic History  . Marital status: Legally Separated    Spouse name: Not on file  . Number of children: Not on  file  . Years of education: Not on file  . Highest education level: Not on file  Occupational History  . Not on file  Social Needs  . Financial resource strain: Not on file  . Food insecurity:    Worry: Not on file    Inability: Not on file  . Transportation needs:    Medical: Not on file    Non-medical: Not on file  Tobacco Use  . Smoking status: Never Smoker  . Smokeless tobacco: Never Used  Substance and Sexual Activity  . Alcohol use: Not Currently  . Drug use: No  . Sexual activity: Not on file  Lifestyle  . Physical activity:    Days per week: Not on file    Minutes per session: Not on file  . Stress: Not on file  Relationships  . Social connections:    Talks on phone: Not on file    Gets together: Not on file    Attends religious service: Not on file    Active member of club or organization: Not on file    Attends meetings of clubs or organizations: Not on file    Relationship status: Not on file  . Intimate partner violence:    Fear of current or ex partner: Not on file    Emotionally abused: Not on file    Physically abused: Not on file    Forced sexual activity: Not on file  Other Topics Concern  . Not on file  Social History Narrative  . Not on file   Family History  Problem Relation Age of Onset  . Diabetes Mother   . Heart disease Mother   . Pulmonary embolism Neg Hx     Physical Exam: Vitals:   01/21/18 2000 01/21/18 2015 01/21/18 2030 01/21/18 2045  BP: 134/67 (!) 142/81 (!) 144/72 (!) 147/69  Pulse: 80 84 82 85  Resp: 17 (!) 21 (!) 23 17  Temp:      TempSrc:      SpO2: 97% 96% 97% 95%  Weight:      Height:       General: Appears calm and comfortable.  Morbidly obese black woman. ENT: Grossly normal hearing, MMM.  Thick neck, no appreciable masses, but does have vague fullness. Cardiovascular: RRR. No M/R/G.  Trace lower extremity edema bilaterally lower and lower extremities. Respiratory: CTA bilaterally.  Breath sounds distant due to  habitus.  No wheezes or crackles. Normal respiratory effort.  Breathing room air. Abdomen: Soft, no guarding or rebound, but generalized abdominal tenderness present. Skin: No rash or induration seen on limited exam, keloids present at prior ear piercing sites Musculoskeletal: Grossly normal tone BUE/BLE. Appropriate ROM.  Generalized muscle tenderness to deep palpation throughout upper and lower extremities Psychiatric: Grossly normal mood and affect. Neurologic: Moves all extremities in coordinated fashion.  I have personally reviewed the following labs, culture data, and imaging studies.  Assessment/Plan:  #Pulmonary embolism with left lower extremity DVT Course: diagnosed with PE and left lower extremity DVT during previous admission in late October 2019. On this admission, [facility] due to lack of VKA prescription that she reports stems from her inability to [facility]. A/P:  this is still relatively early since her prior VTE (<3 months); will re-initiate LMWH at a dose of 1 mg/kg BID. When discussing the optimal longer term AC of choice, she reports LMWH long term was not affordable.  She relays that she would be amenable to VKA therapy if an optimized OP plan for monitoring was established. We did discuss that in general DOAC use has not been well studied in those with weight over 140 kg or BMI > 40; however, if DOAC therapy affordable, she may be willing / interested in pursuing this course.  Will defer optimal oral AC strategy to be decided on 01/22/2018 where information regarding cost, PT recommendations regarding optimal placement, patient preferences, etc can be further elucidated.  #Other problems: -Microcytic anemia:Hb of 9.8 on admission, obtaining iron profile and retic count to further describe etiology, no frank reports of bleeding -GERD: continue home PPI -Concern for difficulty swallowing: speech therapy consultation requested to  further evaluate -Obesity: present on admission, recommend weight optimization, counseling efforts -Anxiety: continue SSRI -Widespread pain / chronic pain syndrome: no specific drivers evident, will check vit D to ensure it is optimized, hold home Lyrica as she reports having difficulty thinking and lack of efficacy, scheduled acetaminophen 1 g TID along with as needed oxycodone with as needed poly glycol for bowel regimen -HTN: continue CCB and BB -Pulmonary nodule: described in prior CT scan in 11/2017 - recommend OP follow up; 5 mm RLL  -Non-insulin dependent DM: most recent A1c of 7.4% at last admission, monitor glycemic control, initiate rapid acting corrective insulin if needed -Functional decline: multifactorial (obesity, chronic pain syndrome), physical therapy to provide recommendations  DVT prophylaxis: Subq Lovenox to treat PE and DVT Code Status: Full code Disposition Plan: Anticipate D/C once optimal OP plan established, as early as 01/22/2018 Admission status: admit to hospital medicine service   Laurell Roof, MD [facility] Page:[phone]  If 7PM-7AM, please contact night-coverage [email or web address] Password [email or web address]  This document was created using the aid of voice recognition / dication software.

## 2018-01-21 NOTE — ED Provider Notes (Signed)
[facility] EMERGENCY DEPARTMENT Provider Note   CSN: [id number] Arrival date & time: 01/21/18  1357     History   Chief Complaint Chief Complaint  Patient presents with  . Shortness of Breath  . Leg Pain    HPI Holly Rodgers is a 46 y.o. female.  Holly Rodgers is a 47 y.o. female with a history of PE, DVT, diabetes, hypertension, chronic pain syndrome, who presents to the emergency department via EMS for evaluation of bilateral lower extremity pain, chest pressure and shortness of breath.  Patient was recently hospitalized on October 31 for a saddle PE with cor pulmonale, upon discharge she was bridged with Lovenox and started on Coumadin.  Patient reports she has been out of her Coumadin for the past 8 days after she was unable to get a refill from her primary care doctor prior to transitioning care to the Coumadin clinic.  Patient reports over the past 3 to 4 days she has had worsening dyspnea, especially on exertion, and has had some right-sided chest pressure which is constant in nature.  Chest pressure is nonradiating.  She denies any syncope or lightheadedness.  She also reports persistent lower extremity pain since discharge from the hospital in November, which has been worsening in the left leg and she is noticed some increased swelling.  Pain worse on the left than right.  No numbness or tingling.  No discoloration.  No redness.  She has not noted any fevers or chills.  She has not taken anything for pain prior to arrival.     Past Medical History:  Diagnosis Date  . Anxiety   . Chronic pain   . Cyst   . Depression   . Diabetes mellitus without complication (HCC)   . Hearing loss   . Hypertension   . Idiopathic dilatation of pulmonary artery (HCC)   . Insomnia   . Snoring   . Tinnitus   . Vertigo     Patient Active Problem List   Diagnosis Date Noted  . Acute saddle pulmonary embolism with acute cor pulmonale (HCC) 12/06/2017  .  Pulmonary nodule 12/06/2017  . Morbid obesity with BMI of 50.0-59.9, adult (HCC) 12/06/2017  . Essential hypertension 12/06/2017  . Diabetes mellitus type 2 in obese (HCC) 12/06/2017  . Right thyroid nodule 08/19/2010    Past Surgical History:  Procedure Laterality Date  . TUBAL LIGATION       OB History   No obstetric history on file.      Home Medications    Prior to Admission medications   Medication Sig Start Date End Date Taking? Authorizing Provider  acetaminophen (TYLENOL) 500 MG tablet Take 1 tablet (500 mg total) by mouth every 8 (eight) hours as needed for moderate pain. 12/08/17 12/08/18  Leroy Sea, MD  cetirizine (ZYRTEC) 10 MG tablet Take 10 mg by mouth daily.    [provider]  Cholecalciferol (VITAMIN D3) 2000 units capsule Take 2,000 Units by mouth daily.    [provider]  diltiazem (CARDIZEM CD) 300 MG 24 hr capsule Take 300 mg by mouth daily.    [provider]  enoxaparin (LOVENOX) 300 MG/3ML SOLN injection Inject 1.6 mLs (160 mg total) into the skin every 12 (twelve) hours. Get INR checked in 3 days by PCP and get Coumadin and Lovenox dose adjusted as needed. 12/11/17   Leroy Sea, MD  gabapentin (NEURONTIN) 600 MG tablet Take 600 mg by mouth 3 (three) times  daily.    [provider]  metoprolol succinate (TOPROL-XL) 50 MG 24 hr tablet Take 50 mg by mouth daily. Take with or immediately following a meal.    [provider]  sertraline (ZOLOFT) 100 MG tablet Take 100 mg by mouth daily.    [provider]  tiZANidine (ZANAFLEX) 4 MG capsule Take 4-8 mg by mouth 2 (two) times daily as needed for muscle spasms (Take 1 tablet in the morning, and 2 tablets at bedtime).     [provider]  warfarin (COUMADIN) 7.5 MG tablet Take 1 tablet (7.5 mg total) by mouth one time only at 6 PM. get INR checked by primary care physician in 3 days and get Coumadin and Lanoxin dose adjusted 12/11/17   Leroy SeaSingh,  Prashant K, MD    Family History Family History  Problem Relation Age of Onset  . Diabetes Mother   . Heart disease Mother   . Pulmonary embolism Neg Hx     Social History Social History   Tobacco Use  . Smoking status: Never Smoker  . Smokeless tobacco: Never Used  Substance Use Topics  . Alcohol use: Not Currently  . Drug use: No     Allergies   Metformin and Metformin and related   Review of Systems Review of Systems  Constitutional: Negative for chills and fever.  HENT: Negative.   Respiratory: Positive for chest tightness and shortness of breath. Negative for wheezing.   Cardiovascular: Positive for chest pain and leg swelling. Negative for palpitations.  Gastrointestinal: Negative for abdominal pain, nausea and vomiting.  Musculoskeletal: Positive for myalgias. Negative for arthralgias.  Skin: Negative for color change, rash and wound.  Neurological: Negative for weakness and numbness.  All other systems reviewed and are negative.    Physical Exam Updated Vital Signs BP (!) 159/80   Pulse 79   Temp 97.9 F (36.6 C) (Oral)   Resp (!) 27   Ht 5\' 11"  (1.803 m)   Wt (!) 190.5 kg   LMP 12/11/2017   SpO2 98%   BMI 58.58 kg/m   Physical Exam Vitals signs and nursing note reviewed.  Constitutional:      General: She is not in acute distress.    Appearance: She is well-developed. She is not ill-appearing, toxic-appearing or diaphoretic.  HENT:     Head: Normocephalic and atraumatic.  Eyes:     General:        Right eye: No discharge.        Left eye: No discharge.  Neck:     Musculoskeletal: Neck supple.  Cardiovascular:     Rate and Rhythm: Normal rate and regular rhythm.     Pulses: Normal pulses.          Radial pulses are 2+ on the right side and 2+ on the left side.       Dorsalis pedis pulses are 2+ on the right side and 2+ on the left side.       Posterior tibial pulses are 2+ on the right side and 2+ on the left side.     Heart sounds:  Normal heart sounds. No murmur. No friction rub. No gallop.   Pulmonary:     Effort: Pulmonary effort is normal. No respiratory distress.     Breath sounds: Normal breath sounds.     Comments: Respirations equal and unlabored, patient able to speak in full sentences, lungs clear to auscultation bilaterally Chest:     Chest wall: No deformity  or tenderness.  Abdominal:     General: Abdomen is flat. Bowel sounds are normal. There is no distension.     Palpations: Abdomen is soft. There is no mass.     Tenderness: There is no abdominal tenderness.     Comments: Respirations equal and unlabored, patient able to speak in full sentences, lungs clear to auscultation bilaterally  Musculoskeletal:        General: Swelling and tenderness present.     Left lower leg: Edema present.     Comments: Lower extremities are tender to palpation, left greater than right, the lower extremities are warm and well-perfused.  Left lower extremity with 2+ pitting edema.  2+ DP and TP pulses, confirmed via Doppler.  Skin:    General: Skin is warm and dry.  Neurological:     Mental Status: She is alert and oriented to person, place, and time. Mental status is at baseline.     Coordination: Coordination normal.  Psychiatric:        Mood and Affect: Mood normal.        Behavior: Behavior normal.      ED Treatments / Results  Labs (all labs ordered are listed, but only abnormal results are displayed) Labs Reviewed  BASIC METABOLIC PANEL - Abnormal; Notable for the following components:      Result Value   Glucose, Bld 110 (*)    All other components within normal limits  CBC - Abnormal; Notable for the following components:   Hemoglobin 9.8 (*)    HCT 35.2 (*)    MCV 79.3 (*)    MCH 22.1 (*)    MCHC 27.8 (*)    RDW 19.0 (*)    All other components within normal limits  PROTIME-INR  I-STAT TROPONIN, ED    EKG None  Radiology No results found.  Procedures Procedures (including critical care  time)  Medications Ordered in ED Medications  iopamidol (ISOVUE-370) 76 % injection (has no administration in time range)  morphine 4 MG/ML injection 4 mg (4 mg Intravenous Given 01/21/18 1441)     Initial Impression / Assessment and Plan / ED Course  I have reviewed the triage vital signs and the nursing notes.  Pertinent labs & imaging results that were available during my care of the patient were reviewed by me and considered in my medical decision making (see chart for details).  Patient presents to the emergency department for evaluation of bilateral leg pain, worse on left than right as well as worsening shortness of breath and chest pressure over the past 3 to 4 days.  She was diagnosed with saddle PE and left DVT at the beginning of November and has been on Coumadin but has been without her Coumadin for the past 8 days.  Lungs are clear to auscultation heart RRR, no tachycardia but patient is intermittently tachypneic, no hypoxia.  Left and right leg are both tender to palpation but left leg does have pitting edema, 2+ pulses in bilateral lower extremities confirmed with Doppler.  Will get CT angios of the chest, repeat DVT studies bilaterally as well as basic labs, troponin and EKG.  Dose of pain medication given for symptomatic management.  EKG without concerning ischemic changes and negative troponin.  No acute electrolyte derangements and normal renal function.  Stable hemoglobin of 9.8, no leukocytosis.  INR is 1.04 which is subtherapeutic.  3:14 PM at shift change care signed out to PA Whitney Muse, who will follow-up on CT PE study  and lower extremity ultrasounds.  If there are signs of increasing clot burden or worsening PE patient will likely need to be admitted otherwise patient can likely be started back on her Coumadin with pharmacy consult and close follow-up with Coumadin clinic and/or PCP.  Final Clinical Impressions(s) / ED Diagnoses   Final diagnoses:  Pain in both  lower extremities  Shortness of breath  Chest pain, unspecified type  History of pulmonary embolus (PE)    ED Discharge Orders    None       Dartha Lodge, [place] 01/21/18 1608    Margarita Grizzle, MD 01/22/18 [phone]

## 2018-01-21 NOTE — ED Notes (Signed)
Attempted report.  Requested I call back in 5 minutes. 

## 2018-01-21 NOTE — ED Provider Notes (Signed)
Care assumed from [facility] [place], [place] at shift change with CTA and DVT studies pending.   In brief, this patient is a 47 y.o. F who had a LLE DVT and saddle PE in October presents for evaluation of worsening left lower extremity pain and swelling and 3 to 4 days of chest pressure and shortness of breath.  Shortness breath is worse with exertion.  Patient had been on Coumadin but has not been on her Coumadin for the last 8 days.  Please see note from previous provider for full history/physical.  PLAN: Patient is pending CTA of chest for evaluation of PE and DVT study.  MDM: DVT study is positive for DVT in the left lower extremity.  It is unchanged from previous.  CTA shows no evidence of central pulmonary embolus.  There is some areas of hypoattenuation in the multiple segmental branches that may indicate residual embolus.  Review of meds show patient had previously been on 7.5 mg of coumadin.  Discussed results with patient.  Patient was unable to get up and ambulate secondary to pain to check pulse ox.  She has not been hypoxic or tachycardic the entire time being here in ED.  Patient given a dose of pain medication but still reports pain in her left lower extremity.  Patient and family concerned about patient's continued uncontrolled pain as this is been a source of her immobility over last 3 weeks.  They report she has not been able to go to the Coumadin clinic or go to primary care follow-up because of her chronic pain.  Patient has already had home health care nurse come to her house.  They told her that she needed to be in a SNF facility.  Patient has been recommended for SNF facility during initial admission but had declined.  Patient reports her functioning at home has decreased to a level that not able to be maintained at home.  Family at bedside agrees that patient has not been able to function at home secondary to her pain in her symptoms.  Additionally, patient will need to be restarted  anticoagulation for continued treatment of her PE and DVT.  Will consult hospitalist for admission.   Discussed patient with hospitalist.  Reason for admission.  1. Pain in both lower extremities   2. Shortness of breath   3. Chest pain, unspecified type   4. History of pulmonary embolus (PE)   5. Acute deep vein thrombosis (DVT) of calf muscle vein of left lower extremity        Rosana HoesLayden, Lindsey A, PA-C 01/21/18 2045    Loren RacerYelverton, David, MD 01/21/18 2307

## 2018-01-21 NOTE — ED Notes (Signed)
Pt transported to CT ?

## 2018-01-21 NOTE — Progress Notes (Signed)
Bilateral venous duplex completed. There is evidence of left lower extremity popliteal vein thrombus, which is essentially unchanged when compared to report of previous study from 12/06/17.  Preliminary results discussed with Dr. Ranae PalmsYelverton.  Refer to "CV Proc" under chart review to view complete preliminary results.  01/21/2018 3:21 PM Gertie FeyMichelle Iren Whipp, MHA, RVT, RDCS, RDMS

## 2018-01-21 NOTE — Plan of Care (Signed)
Pt admitted to floor, oriented to room, unit, and POC for this shift. Pt c/o nausea & was given ODT zofran with positive effect. Admission completed, pt assisted with positioning and personal needs.

## 2018-01-22 DIAGNOSIS — I82462 Acute embolism and thrombosis of left calf muscular vein: Secondary | ICD-10-CM | POA: Diagnosis not present

## 2018-01-22 DIAGNOSIS — I10 Essential (primary) hypertension: Secondary | ICD-10-CM | POA: Diagnosis not present

## 2018-01-22 DIAGNOSIS — Z86711 Personal history of pulmonary embolism: Secondary | ICD-10-CM

## 2018-01-22 DIAGNOSIS — M79604 Pain in right leg: Secondary | ICD-10-CM

## 2018-01-22 DIAGNOSIS — M79605 Pain in left leg: Secondary | ICD-10-CM

## 2018-01-22 LAB — RETICULOCYTES
Immature Retic Fract: 32.9 % — ABNORMAL HIGH (ref 2.3–15.9)
RBC.: 4.14 MIL/uL (ref 3.87–5.11)
Retic Count, Absolute: 58 10*3/uL (ref 19.0–186.0)
Retic Ct Pct: 1.4 % (ref 0.4–3.1)

## 2018-01-22 LAB — BASIC METABOLIC PANEL
Anion gap: 11 (ref 5–15)
BUN: 7 mg/dL (ref 6–20)
CO2: 30 mmol/L (ref 22–32)
CREATININE: 0.73 mg/dL (ref 0.44–1.00)
Calcium: 8.9 mg/dL (ref 8.9–10.3)
Chloride: 98 mmol/L (ref 98–111)
GFR calc Af Amer: >60 mL/min (ref >60)
Glucose, Bld: 111 mg/dL — ABNORMAL HIGH (ref 70–99)
Potassium: 3.5 mmol/L (ref 3.5–5.1)
Sodium: 139 mmol/L (ref 135–145)

## 2018-01-22 LAB — IRON AND TIBC
Iron: 45 ug/dL (ref 28–170)
Saturation Ratios: 11 % (ref 10.4–31.8)
TIBC: 417 ug/dL (ref 250–450)
UIBC: 372 ug/dL

## 2018-01-22 LAB — URINALYSIS, ROUTINE W REFLEX MICROSCOPIC
Bilirubin Urine: NEGATIVE
Glucose, UA: NEGATIVE mg/dL
Ketones, ur: NEGATIVE mg/dL
Leukocytes, UA: NEGATIVE
Nitrite: NEGATIVE
Protein, ur: NEGATIVE mg/dL
Specific Gravity, Urine: 1.006 (ref 1.005–1.030)
pH: 8 (ref 5.0–8.0)

## 2018-01-22 LAB — FERRITIN: Ferritin: 8 ng/mL — ABNORMAL LOW (ref 11–307)

## 2018-01-22 LAB — CBC
HCT: 31.8 % — ABNORMAL LOW (ref 36.0–46.0)
Hemoglobin: 9.4 g/dL — ABNORMAL LOW (ref 12.0–15.0)
MCH: 22.7 pg — ABNORMAL LOW (ref 26.0–34.0)
MCHC: 29.6 g/dL — ABNORMAL LOW (ref 30.0–36.0)
MCV: 76.8 fL — ABNORMAL LOW (ref 80.0–100.0)
PLATELETS: 316 10*3/uL (ref 150–400)
RBC: 4.14 MIL/uL (ref 3.87–5.11)
RDW: 18.8 % — AB (ref 11.5–15.5)
WBC: 9.1 10*3/uL (ref 4.0–10.5)
nRBC: 0 % (ref 0.0–0.2)

## 2018-01-22 MED ORDER — DIPHENHYDRAMINE HCL 25 MG PO CAPS
25.0000 mg | ORAL_CAPSULE | Freq: Once | ORAL | Status: AC
  Administered 2018-01-22: 25 mg via ORAL
  Filled 2018-01-22: qty 1

## 2018-01-22 MED ORDER — WARFARIN - PHARMACIST DOSING INPATIENT: Freq: Every day | Status: DC

## 2018-01-22 MED ORDER — WARFARIN SODIUM 10 MG PO TABS
10.0000 mg | ORAL_TABLET | Freq: Once | ORAL | Status: AC
  Administered 2018-01-22: 10 mg via ORAL
  Filled 2018-01-22: qty 1

## 2018-01-22 NOTE — Evaluation (Signed)
Physical Therapy Evaluation Patient Details Name: Holly Rodgers MRN: [id number] DOB: 01/10/1971 Today's Date: 01/22/2018   History of Present Illness  Kailoni Vahle is a 47yo female who comes to [facility] on 12/15 after 8D unable to resume prescribed anticoagulation (transportation limitations), worsening BLE pain, foudn to have a new DVT. PMH: PE/DVT, morbid obesity, HTN, DM.   Clinical Impression  Pt admitted with above diagnosis. Pt currently with functional limitations due to the deficits listed below (see "PT Problem List"). Upon entry, pt in bed, no family/caregiver present. The pt is awake and agreeable to participate.  Pt reports emergent urge to void at beginning of session, and assisted to EOB, then to [facility]. Pt noted to have increased dizziness, 'vertigo', with each standing attempt. Pt safe and steady on BSC for toiletting and self care.  The pt is alert and oriented x3, pleasant, conversational, and has difficulty following multi-step commands. Functional mobility assessment demonstrates increased effort/time requirements, poor tolerance, and need for physical assistance, whereas the patient performed these at a higher level of independence PTA. Pt has been at home >32month since prior DC without much improvement in mobility/pain management, had no mobility or support outside of home, and was unable to follow medication regimen. Pt's level of deconditioning from baseline and her limited support at home warrants a higher level of care at DC to restore functional independence and safety. Session limited by nausea, dizziness, and HA all of which were present upon entry and worsened with transition to standing. Pt will benefit from skilled PT intervention to increase independence and safety with basic mobility in preparation for discharge to the venue listed below.       Follow Up Recommendations SNF;Supervision for mobility/OOB(Will need a ramp and/or WC for mobility in Out of home)     Equipment Recommendations  None recommended by PT    Recommendations for Other Services       Precautions / Restrictions Precautions Precautions: Fall Restrictions Weight Bearing Restrictions: No      Mobility  Bed Mobility Overal bed mobility: Needs Assistance Bed Mobility: Supine to Sit     Supine to sit: Min assist     General bed mobility comments: Needs assist with legs OOB, but able to come to EOB with mod-I and maximal effort.   Transfers Overall transfer level: Needs assistance Equipment used: (500lb [id number] ) Transfers: Sit to/from Stand;Stand Pivot Transfers Sit to Stand: (perfomred 3 x in session. ) Stand pivot transfers: (perfomred 3 x in session. )       General transfer comment: immediately becomes more dizzy with each attempt, also with bilat frontal and occipital HA without asymmetry.   Ambulation/Gait Ambulation/Gait assistance: (Limited at this time d/t dizziness, HTN, instabiility)              Stairs            Wheelchair Mobility    Modified Rankin (Stroke Patients Only)       Balance Overall balance assessment: Mild deficits observed, not formally tested(well established altered strategies, but very weak and slow. Maximal effort to mobilize)                                           Pertinent Vitals/Pain Pain Assessment: 0-10 Pain Score: 4  Faces Pain Scale: Hurts a little bit Pain Location: BLE  Pain Descriptors / Indicators: Discomfort Pain Intervention(s): Limited  activity within patient's tolerance;Monitored during session    Home Living Family/patient expects to be discharged to:: Private residence Living Arrangements: Non-relatives/Friends(friend, no longer romanticaly involved, but assists with care as able; he is also disabled with chronic back issues, does not drive) Available Help at Discharge: Friend(s) Type of Home: House Home Access: Stairs to enter Entrance Stairs-Rails: None Entrance  Stairs-Number of Steps: 1 Home Layout: One level Home Equipment: Toilet riser;Shower seat;Walker - 4 wheels;Other (comment)(lift chair)      Prior Function Level of Independence: Independent with assistive device(s)         Comments: SInce last admission (6WA) has been struggling to perform household distances d/t pain in legs.      Hand Dominance   Dominant Hand: Right    Extremity/Trunk Assessment   Upper Extremity Assessment Upper Extremity Assessment: Generalized weakness;Overall [facility] for tasks assessed    Lower Extremity Assessment Lower Extremity Assessment: Generalized weakness;Overall [facility] for tasks assessed    Cervical / Trunk Assessment Cervical / Trunk Assessment: (contortued postural changes d/t large habitus and chronic pain and weakness)  Communication   Communication: No difficulties  Cognition Arousal/Alertness: Awake/alert Behavior During Therapy: WFL for tasks assessed/performed Overall Cognitive Status: Within Functional Limits for tasks assessed(some difficult yfollowing   multistep commands when mobilizing in room. )                                        General Comments      Exercises     Assessment/Plan    PT Assessment Patient needs continued PT services  PT Problem List Decreased strength;Decreased range of motion;Decreased activity tolerance;Decreased balance;Decreased mobility;Decreased cognition;Obesity;Pain       PT Treatment Interventions DME instruction;Therapeutic exercise;Gait training;Stair training;Functional mobility training;Therapeutic activities;Patient/family education;Balance training    PT Goals (Current goals can be found in the Care Plan section)  Acute Rehab PT Goals Patient Stated Goal: decrease nausea and dizziness and leg pain  PT Goal Formulation: With patient Time For Goal Achievement: 02/05/18 Potential to Achieve Goals: Good    Frequency Min 3X/week   Barriers to discharge Inaccessible  home environment;Decreased caregiver support 1 high step to enter    Co-evaluation               AM-PAC PT "6 Clicks" Mobility  Outcome Measure Help needed turning from your back to your side while in a flat bed without using bedrails?: A Little Help needed moving from lying on your back to sitting on the side of a flat bed without using bedrails?: A Little Help needed moving to and from a bed to a chair (including a wheelchair)?: A Little Help needed standing up from a chair using your arms (e.g., wheelchair or bedside chair)?: A Little Help needed to walk in hospital room?: A Lot Help needed climbing 3-5 steps with a railing? : Total 6 Click Score: 15    End of Session   Activity Tolerance: Patient tolerated treatment well;Treatment limited secondary to medical complications (Comment)(HTN dizziness nausea) Patient left: in chair;with call bell/phone within reach(chair under feet) Nurse Communication: Other (comment)(RN in room, discussing HTN, HA, dizziness) PT Visit Diagnosis: Unsteadiness on feet (R26.81);Other abnormalities of gait and mobility (R26.89);Muscle weakness (generalized) (M62.81);Difficulty in walking, not elsewhere classified (R26.2);Dizziness and giddiness (R42)    Time: 1610-9604 PT Time Calculation (min) (ACUTE ONLY): 41 min   Charges:   PT Evaluation $PT Eval Moderate  Complexity: 1 Mod PT Treatments $Therapeutic Activity: 8-22 mins        2:36 PM, 01/22/18 Rosamaria LintsAllan C Khaden Gater, PT, DPT Physical Therapist - [place] [phone] (Pager)  [phone] (Office)      Sylina Henion C 01/22/2018, 2:31 PM

## 2018-01-22 NOTE — Plan of Care (Signed)
  Problem: Nutrition: Goal: Adequate nutrition will be maintained Outcome: Progressing   

## 2018-01-22 NOTE — Evaluation (Signed)
Clinical/Bedside Swallow Evaluation Patient Details  Name: Holly Rodgers MRN: [id number] Date of Birth: [birth year]  Today's Date: 01/22/2018 Time: SLP Start Time (ACUTE ONLY): 1150 SLP Stop Time (ACUTE ONLY): 1205 SLP Time Calculation (min) (ACUTE ONLY): 15 min  Past Medical History:  Past Medical History:  Diagnosis Date  . Anxiety   . Chronic pain   . Cyst   . Depression   . Diabetes mellitus without complication (HCC)   . Hearing loss   . Hypertension   . Idiopathic dilatation of pulmonary artery (HCC)   . Insomnia   . Snoring   . Tinnitus   . Vertigo    Past Surgical History:  Past Surgical History:  Procedure Laterality Date  . TUBAL LIGATION     HPI:  Pt is a 47 year old woman admitted with generalized pain after recent admission (10/29-19-12/11/17) for PE, LLE DVT. Per MD H&P note 12/15, pt described difficulty swallowing solids and liquids. CTA Chest showed no airspace consolidation. PMH: morbid obesity, not insulin-dependent diet-controlled diabetes, hypertension, anxiety, chronic pain syndrome, anxiety, hearing loss, GERD   Assessment / Plan / Recommendation Clinical Impression  Pt has no overt signs of aspiration, and oropharyngeal swallow appears grossly functional (as can be assessed clinically). Her symptoms seem to be more esophageal (globus sensation), and some of her behaviors are also somewhat compensatory in nature (feels like she needs to hold boluses in her mouth sometimes, takes small bites/sips, thoroughly masticates). Would continue with regular diet textures and thin liquids as tolerated. SLP to sign off acutely - MD may wish to consider esophageal w/u if symptoms persist. SLP Visit Diagnosis: Dysphagia, unspecified (R13.10)    Aspiration Risk  No limitations    Diet Recommendation Regular;Thin liquid   Liquid Administration via: Cup;Straw Medication Administration: Whole meds with liquid Supervision: Patient able to self feed;Intermittent  supervision to cue for compensatory strategies Compensations: Slow rate;Small sips/bites Postural Changes: Seated upright at 90 degrees;Remain upright for at least 30 minutes after po intake    Other  Recommendations Recommended Consults: Consider esophageal assessment Oral Care Recommendations: Oral care BID   Follow up Recommendations None      Frequency and Duration            Prognosis        Swallow Study   General HPI: Pt is a 47 year old woman admitted with generalized pain after recent admission (10/29-19-12/11/17) for PE, LLE DVT. Per MD H&P note 12/15, pt described difficulty swallowing solids and liquids. CTA Chest showed no airspace consolidation. PMH: morbid obesity, not insulin-dependent diet-controlled diabetes, hypertension, anxiety, chronic pain syndrome, anxiety, hearing loss, GERD Type of Study: Bedside Swallow Evaluation Previous Swallow Assessment: none in chart Diet Prior to this Study: Regular;Thin liquids Temperature Spikes Noted: No Respiratory Status: Room air History of Recent Intubation: No Behavior/Cognition: Alert;Cooperative;Pleasant mood Oral Cavity Assessment: Within Functional Limits Oral Care Completed by SLP: No Oral Cavity - Dentition: Adequate natural dentition Vision: Functional for self-feeding Self-Feeding Abilities: Able to feed self Patient Positioning: Upright in bed Baseline Vocal Quality: Normal Volitional Cough: Strong Volitional Swallow: (pt attempting but grimacing, showing significant effort)    Oral/Motor/Sensory Function Overall Oral Motor/Sensory Function: Within functional limits   Ice Chips Ice chips: Not tested   Thin Liquid Thin Liquid: Within functional limits Presentation: Cup;Self Fed;Straw    Nectar Thick Nectar Thick Liquid: Not tested   Honey Thick Honey Thick Liquid: Not tested   Puree Puree: Within functional limits Presentation: Self Fed;Spoon   Solid  Solid: Within functional limits Presentation:  Self Holly ModeFed      Rodgers, Holly Rodgers 01/22/2018,1:18 PM  Holly HamLaura Rodgers, M.A. CCC-SLP Acute Herbalistehabilitation Services Pager [phone] Office [phone]

## 2018-01-22 NOTE — Progress Notes (Signed)
ANTICOAGULATION CONSULT NOTE - Initial Consult  Pharmacy Consult for coumadin Indication: pulmonary embolus and DVT  Allergies  Allergen Reactions  . Metformin     Other reaction(s): GI Upset (intolerance)  . Metformin And Related     Patient Measurements: Height: 5\' 11"  (180.3 cm) Weight: (!) 420 lb (190.5 kg) IBW/kg (Calculated) : 70.8 Heparin Dosing Weight:   Vital Signs: Temp: 97.7 F (36.5 C) (12/16 0835) Temp Source: Oral (12/16 0835) BP: 157/90 (12/16 0835) Pulse Rate: 96 (12/16 0835)  Labs: Recent Labs    01/21/18 1409 01/22/18 0231  HGB 9.8* 9.4*  HCT 35.2* 31.8*  PLT 344 316  LABPROT 13.5  --   INR 1.04  --   CREATININE 0.77 0.73    Estimated Creatinine Clearance: 162.9 mL/min (by C-G formula based on SCr of 0.73 mg/dL).   Medical History: Past Medical History:  Diagnosis Date  . Anxiety   . Chronic pain   . Cyst   . Depression   . Diabetes mellitus without complication (HCC)   . Hearing loss   . Hypertension   . Idiopathic dilatation of pulmonary artery (HCC)   . Insomnia   . Snoring   . Tinnitus   . Vertigo     Medications:  Scheduled:  . acetaminophen  1,000 mg Oral Q8H  . diltiazem  300 mg Oral Daily  . enoxaparin (LOVENOX) injection  190 mg Subcutaneous Q12H  . metoprolol succinate  50 mg Oral Daily  . pantoprazole  40 mg Oral Daily  . sertraline  100 mg Oral Daily  . sodium chloride flush  3 mL Intravenous Q12H  . warfarin  10 mg Oral ONCE-1800  . Warfarin - Pharmacist Dosing Inpatient   Does not apply q1800   Infusions:    Assessment: Holly Rodgers has been on coumadin for her PE. However, she missed the last week or so of because of miss appts. Lovenox was started last PM because her INR is 1. Coumadin was ordered to be resumed today. Due to her wt, there may not be good data for DOAC but we may have to use it if she is dc home.   PTA coumadin = 7.5mg  qday  Goal of Therapy:  INR 2-3 Monitor platelets by anticoagulation  protocol: Yes   Plan:  Coumadin 10mg  PO x1 Daily INR, CBC q3d  Ulyses SouthwardMinh Pham, PharmD, BCIDP, AAHIVP, CPP Infectious Disease Pharmacist 01/22/2018 12:15 PM

## 2018-01-22 NOTE — Care Management (Signed)
7.   S / W [place]OR[place]  @ [employer] # [id number]  APIXABAN : NONE FORMULARY ELIQUIS 10 MG BID : NONE FORMULARY  1. ELIQUIS 2.5 MG BID COVER- YES CO-PAY- ZERO DOLLARS Q/L TWO PILL PER DAYS PRIOR APPROVAL- NO  2. ELIQUIS  5 MG BID COVER- YES CO-PAY- ZERO DOLLARS Q/L TWO PILL PER DAYS PRIOR APPROVAL- NO  RIVAROXABAN: NONE FORMULARY  3. XARELTO   15 MG BID COVER- YES CO-PAY- ZERO DOLLARS  Q/L TWO PILL PER DAY PRIOR APPROVAL- NO  4. XARELTO 20 MG DAILY COVER- YES CO-PAY- ZERO DOLLARS  Q/L TWO  PILL PER DAYS PRIOR APPROVAL- NO  NO DEDUCTIBLE NO OUT-OF-POCKET PREFERRED PHARMACY : YES [facility] ZERO DOLLARS CO-PAY  FOR ALL PRESCRIPTION UNTIL DEC 09,[id number],2019

## 2018-01-22 NOTE — Progress Notes (Signed)
PROGRESS NOTE        PATIENT DETAILS Name: Holly Rodgers Age: 47 y.o. Sex: female Date of Birth: [birth year] Admit Date: 01/21/2018 Admitting Physician Elder Love, MD ZOX:[id number], Hessie Diener, MD  Brief Narrative: Patient is a 47 y.o. female with history of  morbid obesity recent history of venous thromboembolism (saddle embolus and DVT)-refused SNF during her most recent hospitalization last month for VTE-noncompliant to anticoagulation (claims she ran out of Coumadin)-presented to the hospital with generalized body pains-but mostly worse in her bilateral lower extremities.  She also claimed to have worsening shortness of breath.  See below for further details.  Subjective: Lying comfortably in bed-no chest pain or shortness of breath.  Assessment/Plan: Recent history of submassive pulmonary embolism with left lower extremity DVT: Unfortunately has been off Coumadin for more than 1 week-claims that she was having issues getting her INR checked and was having issues with her primary care practitioner.  Continue Lovenox-we will restart Coumadin.  Patient request that social work be consulted to see if she can go to SNF-if this can be arranged-we tentatively could continue overlapping Lovenox and Coumadin on discharge.  If she is not able to go to SNF-then we may need to rethink about a alternate anticoagulation agent-as she is not able to get out of the house (morbidly obese) and get her INR checked  Hypertension: Controlled-continue Cardizem and metoprolol.  GERD: Continue PPI  Microcytic anemia: No evidence of overt blood loss-she denies melena/hematochezia-monitor CBC periodically-since this appears to be a chronic issue-further work-up deferred to the outpatient setting.  DM-2: Diet controlled-recent A1c was 7.4-May need to be placed on metformin on discharge.  For now we will start SSI and follow.  Pulmonary nodule: Incidental finding-history of  smoking-needs outpatient follow-up for repeat CT scan in 6 to 12 months.  Generalized weakness/deconditioning/generalized pain: Suspect she has osteoarthritis/DVT causing pain in her lower extremities-her exam is benign-she is morbidly obese-which limits exam.  PT evaluation ordered  Morbid obesity  DVT Prophylaxis: Full dose anticoagulation with Lovenox/Coumadin  Code Status: Full code   Family Communication: None at bedside  Disposition Plan: Remain inpatient  Antimicrobial agents: Anti-infectives (From admission, onward)   None      Procedures: None  CONSULTS:  None  Time spent: 25- minutes-Greater than 50% of this time was spent in counseling, explanation of diagnosis, planning of further management, and coordination of care.  MEDICATIONS: Scheduled Meds: . acetaminophen  1,000 mg Oral Q8H  . diltiazem  300 mg Oral Daily  . enoxaparin (LOVENOX) injection  190 mg Subcutaneous Q12H  . metoprolol succinate  50 mg Oral Daily  . pantoprazole  40 mg Oral Daily  . sertraline  100 mg Oral Daily  . sodium chloride flush  3 mL Intravenous Q12H   Continuous Infusions: PRN Meds:.ondansetron, oxyCODONE, polyethylene glycol   PHYSICAL EXAM: Vital signs: Vitals:   01/21/18 2139 01/21/18 2219 01/21/18 2301 01/22/18 0835  BP:  (!) 142/81 (!) 150/63 (!) 157/90  Pulse:  84 84 96  Resp:  18 18   Temp: 98.7 F (37.1 C) 98.7 F (37.1 C) 98.5 F (36.9 C) 97.7 F (36.5 C)  TempSrc: Oral Oral Oral Oral  SpO2:  97% 95% 97%  Weight:      Height:       Filed Weights   01/21/18 1404  Weight: (!) 190.5  kg   Body mass index is 58.58 kg/m.   General appearance :Awake, alert, not in any distress.  HEENT: Atraumatic and Normocephalic Neck: supple Resp:Good air entry bilaterally, no added sounds  CVS: S1 S2 regular GI: Bowel sounds present, Non tender and not distended with no gaurding, rigidity or rebound.No organomegaly Extremities: B/L Lower Ext shows no edema-is  obese Neurology:  speech clear,Non focal, sensation is grossly intact. Musculoskeletal:No digital cyanosis Skin:No Rash, warm and dry Wounds:N/A  I have personally reviewed following labs and imaging studies  LABORATORY DATA: CBC: Recent Labs  Lab 01/21/18 1409 01/22/18 0231  WBC 7.0 9.1  HGB 9.8* 9.4*  HCT 35.2* 31.8*  MCV 79.3* 76.8*  PLT 344 316    Basic Metabolic Panel: Recent Labs  Lab 01/21/18 1409 01/22/18 0231  NA 140 139  K 3.8 3.5  CL 99 98  CO2 29 30  GLUCOSE 110* 111*  BUN 9 7  CREATININE 0.77 0.73  CALCIUM 9.1 8.9    GFR: Estimated Creatinine Clearance: 162.9 mL/min (by C-G formula based on SCr of 0.73 mg/dL).  Liver Function Tests: No results for input(s): AST, ALT, ALKPHOS, BILITOT, PROT, ALBUMIN in the last 168 hours. No results for input(s): LIPASE, AMYLASE in the last 168 hours. No results for input(s): AMMONIA in the last 168 hours.  Coagulation Profile: Recent Labs  Lab 01/21/18 1409  INR 1.04    Cardiac Enzymes: No results for input(s): CKTOTAL, CKMB, CKMBINDEX, TROPONINI in the last 168 hours.  BNP (last 3 results) No results for input(s): PROBNP in the last 8760 hours.  HbA1C: No results for input(s): HGBA1C in the last 72 hours.  CBG: No results for input(s): GLUCAP in the last 168 hours.  Lipid Profile: No results for input(s): CHOL, HDL, LDLCALC, TRIG, CHOLHDL, LDLDIRECT in the last 72 hours.  Thyroid Function Tests: No results for input(s): TSH, T4TOTAL, FREET4, T3FREE, THYROIDAB in the last 72 hours.  Anemia Panel: Recent Labs    01/22/18 0231  FERRITIN 8*  TIBC 417  IRON 45  RETICCTPCT 1.4    Urine analysis: No results found for: COLORURINE, APPEARANCEUR, LABSPEC, PHURINE, GLUCOSEU, HGBUR, BILIRUBINUR, KETONESUR, PROTEINUR, UROBILINOGEN, NITRITE, LEUKOCYTESUR  Sepsis Labs: Lactic Acid, Venous No results found for: LATICACIDVEN  MICROBIOLOGY: No results found for this or any previous visit (from the  past 240 hour(s)).  RADIOLOGY STUDIES/RESULTS: Ct Angio Chest Pe W And/or Wo Contrast  Result Date: 01/21/2018 CLINICAL DATA:  Bilateral lower extremity pain. Recent history of pulmonary emboli and DVT. Recent cessation of Coumadin. EXAM: CT ANGIOGRAPHY CHEST WITH CONTRAST TECHNIQUE: Multidetector CT imaging of the chest was performed using the standard protocol during bolus administration of intravenous contrast. Multiplanar CT image reconstructions and MIPs were obtained to evaluate the vascular anatomy. CONTRAST:  ISOVUE-370 IOPAMIDOL (ISOVUE-370) INJECTION 76% COMPARISON:  CTA chest 12/06/2017 FINDINGS: Cardiovascular: --Pulmonary arteries: Satisfactory contrast bolus. Beam attenuation caused by patient body habitus limits assessment beyond the proximal segmental level.Within the previously mentioned limitation, there is no acute pulmonary embolus. There are areas of hypoattenuation in some bilateral segmental branches that may indicate a small amount of residual embolus within these arteries, but signal to noise ratio is poor peripherally. No new filling defect. No central filling defect. The main pulmonary artery is mildly enlarged, measuring 3.7 cm. --Aorta: Limited opacification of the aorta due to bolus timing optimization for the pulmonary arteries. Conventional 3 vessel aortic branching pattern. The aortic course and caliber are normal. There is no aortic atherosclerosis. --Heart:  Mild cardiomegaly. No pericardial effusion. Mediastinum/Nodes: No mediastinal, hilar or axillary lymphadenopathy. The visualized thyroid and thoracic esophageal course are unremarkable. Lungs/Pleura: No pulmonary nodules or masses. No pleural effusion or pneumothorax. No focal airspace consolidation. No focal pleural abnormality. Upper Abdomen: Contrast bolus timing is not optimized for evaluation of the abdominal organs. Within this limitation, the visualized organs of the upper abdomen are normal. Musculoskeletal:  No chest wall abnormality. No acute or significant osseous findings. Review of the MIP images confirms the above findings. IMPRESSION: 1. No central pulmonary embolus. 2. Areas of hypoattenuation in multiple segmental branches may indicate residual embolus, but beam attenuation due to body habitus limits assessment of these distal arteries due to poor signal-to-noise ratio. 3. Mildly enlarged main pulmonary artery, which may indicate underlying pulmonary hypertension. Electronically Signed   By: Deatra Robinson M.D.   On: 01/21/2018 17:48   Vas [place] Lower Extremity Venous (dvt) (mc And Wl 7a-7p)  Result Date: 01/21/2018  Lower Venous Study Indications: Pain, and History of left popliteal vein DVT 12/06/17.  Limitations: Body habitus. Performing Technologist: Gertie Fey MHA, RDMS, RVT, RDCS  Examination Guidelines: A complete evaluation includes B-mode imaging, spectral Doppler, color Doppler, and power Doppler as needed of all accessible portions of each vessel. Bilateral testing is considered an integral part of a complete examination. Limited examinations for reoccurring indications may be performed as noted.  Right Venous Findings: +---------+---------------+---------+-----------+----------+------------------+          CompressibilityPhasicitySpontaneityPropertiesSummary            +---------+---------------+---------+-----------+----------+------------------+ CFV      Full           Yes      Yes                                     +---------+---------------+---------+-----------+----------+------------------+ SFJ      Full                                                            +---------+---------------+---------+-----------+----------+------------------+ FV Prox  Full                                                            +---------+---------------+---------+-----------+----------+------------------+ FV Mid   Full                                                             +---------+---------------+---------+-----------+----------+------------------+ FV DistalFull                                                            +---------+---------------+---------+-----------+----------+------------------+ POP      Full  Yes      Yes                                     +---------+---------------+---------+-----------+----------+------------------+ PTV      Full                                         Limited evaluation +---------+---------------+---------+-----------+----------+------------------+ PERO     Full                                         Limited evaluation +---------+---------------+---------+-----------+----------+------------------+  Left Venous Findings: +---------+---------------+---------+-----------+----------+------------------+          CompressibilityPhasicitySpontaneityPropertiesSummary            +---------+---------------+---------+-----------+----------+------------------+ CFV      Full           Yes      Yes                                     +---------+---------------+---------+-----------+----------+------------------+ SFJ      Full                                                            +---------+---------------+---------+-----------+----------+------------------+ FV Prox  Full                                                            +---------+---------------+---------+-----------+----------+------------------+ FV Mid   Full                                                            +---------+---------------+---------+-----------+----------+------------------+ FV DistalFull                                                            +---------+---------------+---------+-----------+----------+------------------+ POP      None                    No                   Acute               +---------+---------------+---------+-----------+----------+------------------+ PTV      Full                                         Limited evaluation +---------+---------------+---------+-----------+----------+------------------+  PERO     Full                                         Limited evaluation +---------+---------------+---------+-----------+----------+------------------+    Summary: Right: There is no evidence of deep vein thrombosis in the lower extremity. However, portions of this examination were limited- see technologist comments above. No cystic structure found in the popliteal fossa. Left: Findings consistent with acute deep vein thrombosis involving the left popliteal vein. Findings appear essentially unchanged compared to previous examination. No cystic structure found in the popliteal fossa.  *See table(s) above for measurements and observations. Electronically signed by Waverly Ferrarihristopher Dickson MD on 01/21/2018 at 5:52:08 PM.    Final      LOS: 0 days   Jeoffrey MassedShanker Polo Mcmartin, MD  [facility]  If 7PM-7AM, please contact night-coverage  Please page via [email or web address]-Password [email or web address]-click on MD name and type text message  01/22/2018, 11:37 AM

## 2018-01-23 DIAGNOSIS — I2699 Other pulmonary embolism without acute cor pulmonale: Secondary | ICD-10-CM | POA: Diagnosis not present

## 2018-01-23 DIAGNOSIS — I10 Essential (primary) hypertension: Secondary | ICD-10-CM | POA: Diagnosis not present

## 2018-01-23 DIAGNOSIS — I82462 Acute embolism and thrombosis of left calf muscular vein: Secondary | ICD-10-CM | POA: Diagnosis not present

## 2018-01-23 LAB — CBC
HCT: 31.8 % — ABNORMAL LOW (ref 36.0–46.0)
Hemoglobin: 9.4 g/dL — ABNORMAL LOW (ref 12.0–15.0)
MCH: 22.8 pg — ABNORMAL LOW (ref 26.0–34.0)
MCHC: 29.6 g/dL — ABNORMAL LOW (ref 30.0–36.0)
MCV: 77 fL — ABNORMAL LOW (ref 80.0–100.0)
Platelets: 301 10*3/uL (ref 150–400)
RBC: 4.13 MIL/uL (ref 3.87–5.11)
RDW: 19 % — ABNORMAL HIGH (ref 11.5–15.5)
WBC: 8.2 10*3/uL (ref 4.0–10.5)
nRBC: 0.2 % (ref 0.0–0.2)

## 2018-01-23 LAB — PROTIME-INR
INR: 1.06
Prothrombin Time: 13.7 seconds (ref 11.4–15.2)

## 2018-01-23 LAB — GLUCOSE, CAPILLARY: Glucose-Capillary: 136 mg/dL — ABNORMAL HIGH (ref 70–99)

## 2018-01-23 LAB — VITAMIN D 25 HYDROXY (VIT D DEFICIENCY, FRACTURES): Vit D, 25-Hydroxy: 14.4 ng/mL — ABNORMAL LOW (ref 30.0–100.0)

## 2018-01-23 MED ORDER — ACETAMINOPHEN 500 MG PO TABS: 1000.0000 mg | ORAL_TABLET | Freq: Three times a day (TID) | ORAL | 0 refills | Status: AC

## 2018-01-23 MED ORDER — POLYETHYLENE GLYCOL 3350 17 G PO PACK: 17.0000 g | PACK | Freq: Every day | ORAL | 0 refills | Status: AC | PRN

## 2018-01-23 MED ORDER — WARFARIN SODIUM 5 MG PO TABS: ORAL_TABLET | ORAL | Status: AC

## 2018-01-23 MED ORDER — WARFARIN SODIUM 10 MG PO TABS
10.0000 mg | ORAL_TABLET | Freq: Once | ORAL | Status: AC
  Administered 2018-01-23: 10 mg via ORAL
  Filled 2018-01-23: qty 1

## 2018-01-23 MED ORDER — ENOXAPARIN SODIUM 300 MG/3ML IJ SOLN: 190.0000 mg | Freq: Two times a day (BID) | INTRAMUSCULAR | Status: AC

## 2018-01-23 NOTE — NC FL2 (Signed)
[place] MEDICAID FL2 LEVEL OF CARE SCREENING TOOL     IDENTIFICATION  Patient Name: Holly HoppingChristie M Corso Birthdate: 07/04/1970 Sex: female Admission Date (Current Location): 01/21/2018  [facility] [place]   Facility and Address:  [facility], [address], [place], [place] [address]      Provider Number: [id number]  Attending Physician Name and Address:  Maretta BeesGhimire, Shanker M, MD  Relative Name and Phone Number:       Current Level of Care: Hospital Recommended Level of Care: Skilled Nursing Facility Prior Approval Number:    Date Approved/Denied:   PASRR Number: [id number]  Discharge Plan: SNF    Current Diagnoses: Patient Active Problem List   Diagnosis Date Noted  . Pulmonary embolism (HCC) 01/21/2018  . Acute saddle pulmonary embolism with acute cor pulmonale (HCC) 12/06/2017  . Pulmonary nodule 12/06/2017  . Morbid obesity with BMI of 50.0-59.9, adult (HCC) 12/06/2017  . Essential hypertension 12/06/2017  . Diabetes mellitus type 2 in obese (HCC) 12/06/2017  . Right thyroid nodule 08/19/2010    Orientation RESPIRATION BLADDER Height & Weight     Self, Time, Situation, Place  Normal Continent Weight: (!) 419 lb 15.6 oz (190.5 kg) Height:  5\' 11"  (180.3 cm)  BEHAVIORAL SYMPTOMS/MOOD NEUROLOGICAL BOWEL NUTRITION STATUS      Continent Diet(regular diet, thin liquids)  AMBULATORY STATUS COMMUNICATION OF NEEDS Skin   Limited Assist Verbally Normal                       Personal Care Assistance Level of Assistance  Dressing, Feeding, Bathing Bathing Assistance: Limited assistance Feeding assistance: Independent Dressing Assistance: Limited assistance     Functional Limitations Info  Sight, Hearing, Speech Sight Info: Adequate Hearing Info: Adequate Speech Info: Adequate    SPECIAL CARE FACTORS FREQUENCY  PT (By licensed PT), OT (By licensed OT)     PT Frequency: 3x OT Frequency: 3x             Contractures Contractures Info: Not present    Additional Factors Info  Code Status, Allergies Code Status Info: Full Code Allergies Info: Metformin, Metformin And Related           Current Medications (01/23/2018):  This is the current hospital active medication list Current Facility-Administered Medications  Medication Dose Route Frequency Provider Last Rate Last Dose  . acetaminophen (TYLENOL) tablet 1,000 mg  1,000 mg Oral Q8H Elder LoveKuhlman, Patrick D, MD   1,000 mg at 01/22/18 2312  . diltiazem (CARDIZEM CD) 24 hr capsule 300 mg  300 mg Oral Daily Elder LoveKuhlman, Patrick D, MD   300 mg at 01/23/18 0943  . enoxaparin (LOVENOX) injection 190 mg  190 mg Subcutaneous Q12H Elder LoveKuhlman, Patrick D, MD   190 mg at 01/23/18 0944  . metoprolol succinate (TOPROL-XL) 24 hr tablet 50 mg  50 mg Oral Daily Elder LoveKuhlman, Patrick D, MD   50 mg at 01/23/18 0943  . ondansetron (ZOFRAN-ODT) disintegrating tablet 4 mg  4 mg Oral Q8H PRN Elder LoveKuhlman, Patrick D, MD   4 mg at 01/23/18 [id number]  . oxyCODONE (Oxy IR/ROXICODONE) immediate release tablet 5 mg  5 mg Oral Q4H PRN Elder LoveKuhlman, Patrick D, MD   5 mg at 01/23/18 [id number]  . pantoprazole (PROTONIX) EC tablet 40 mg  40 mg Oral Daily Elder LoveKuhlman, Patrick D, MD   40 mg at 01/23/18 0944  . polyethylene glycol (MIRALAX / GLYCOLAX) packet 17 g  17 g Oral Daily PRN  Elder Love, MD      . sertraline (ZOLOFT) tablet 100 mg  100 mg Oral Daily Elder Love, MD   100 mg at 01/23/18 0944  . sodium chloride flush (NS) 0.9 % injection 3 mL  3 mL Intravenous Q12H Elder Love, MD   3 mL at 01/23/18 0823  . warfarin (COUMADIN) tablet 10 mg  10 mg Oral ONCE-1800 Ghimire, Werner Lean, MD      . Warfarin - Pharmacist Dosing Inpatient   Does not apply [id number] Maretta Bees, MD         Discharge Medications: Please see discharge summary for a list of discharge medications.  Relevant Imaging Results:  Relevant Lab Results:   Additional Information SS#: [id number]  Maree Krabbe, LCSW

## 2018-01-23 NOTE — Clinical Social Work Note (Signed)
Clinical Social Work Assessment  Patient Details  Name: Holly Rodgers MRN: [id number] Date of Birth: 09/24/1970  Date of referral:  01/23/18               Reason for consult:  Facility Placement                Permission sought to share information with:  Oceanographeracility Contact Representative Permission granted to share information::  Yes, Verbal Permission Granted  Name::        Agency::  [facility]  Relationship::     Contact Information:     Housing/Transportation Living arrangements for the past 2 months:  Single Family Home Source of Information:  Patient Patient Interpreter Needed:  None Criminal Activity/Legal Involvement Pertinent to Current Situation/Hospitalization:  No - Comment as needed Significant Relationships:  Friend Lives with:  Self Do you feel safe going back to the place where you live?  No Need for family participation in patient care:  No (Coment)  Care giving concerns:  Pt alert and oriented.   Social Worker assessment / plan:  CSW spoke with pt. Pt is from home alone. Pt is agreeable to SNF. Pt states "im not going to be there long." Pt gave CSW verbal permission to determine which facilities have a bariatric bed and see if pt can take. CSW to follow up with pt regarding options to determine choice. Pt will need UHC auth prior to going to SNF.  Employment status:    Insurance informationAdvertising account executive:  Managed Medicare PT Recommendations:  Skilled Nursing Facility Information / Referral to community resources:  Skilled Nursing Facility  Patient/Family's Response to care:  Pt verbalized understanding of CSW role and expressed appreciation for support. Pt denies any concern regarding pt care at this time.   Patient/Family's Understanding of and Emotional Response to Diagnosis, Current Treatment, and Prognosis:  Pt understanding and realistic regarding physical limitations. Pt understands the need for SNF placement at d/c. Pt agreeable to SNF placement at d/c, at this  time. Pt's responses emotionally appropriate during conversation with CSW. Pt denies any concern regarding treatment plan at this time. CSW will continue to provide support and facilitate d/c needs.   Emotional Assessment Appearance:  Appears stated age Attitude/Demeanor/Rapport:  (Patient was appropriate) Affect (typically observed):  Accepting, Appropriate, Calm Orientation:  Oriented to Self, Oriented to  Time, Oriented to Situation, Oriented to Place Alcohol / Substance use:  Not Applicable Psych involvement (Current and /or in the community):  No (Comment)  Discharge Needs  Concerns to be addressed:  Care Coordination, Basic Needs Readmission within the last 30 days:  No Current discharge risk:  Dependent with Mobility Barriers to Discharge:  Continued Medical Work up   Pacific MutualBridget A Gianelle Mccaul, LCSW 01/23/2018, 10:14 AM

## 2018-01-23 NOTE — Progress Notes (Signed)
ANTICOAGULATION CONSULT NOTE - Initial Consult  Pharmacy Consult for coumadin Indication: pulmonary embolus and DVT  Allergies  Allergen Reactions  . Metformin     Other reaction(s): GI Upset (intolerance)  . Metformin And Related     Patient Measurements: Height: 5\' 11"  (180.3 cm) Weight: (!) 419 lb 15.6 oz (190.5 kg) IBW/kg (Calculated) : 70.8 Heparin Dosing Weight:   Vital Signs: Temp: 98.3 F (36.8 C) (12/17 0805) Temp Source: Oral (12/17 0805) BP: 148/85 (12/17 0805) Pulse Rate: 85 (12/17 0805)  Labs: Recent Labs    01/21/18 1409 01/22/18 0231 01/23/18 0241  HGB 9.8* 9.4* 9.4*  HCT 35.2* 31.8* 31.8*  PLT 344 316 301  LABPROT 13.5  --  13.7  INR 1.04  --  1.06  CREATININE 0.77 0.73  --     Estimated Creatinine Clearance: 162.9 mL/min (by C-G formula based on SCr of 0.73 mg/dL).   Medical History: Past Medical History:  Diagnosis Date  . Anxiety   . Chronic pain   . Cyst   . Depression   . Diabetes mellitus without complication (HCC)   . Hearing loss   . Hypertension   . Idiopathic dilatation of pulmonary artery (HCC)   . Insomnia   . Snoring   . Tinnitus   . Vertigo     Medications:  Scheduled:  . acetaminophen  1,000 mg Oral Q8H  . diltiazem  300 mg Oral Daily  . enoxaparin (LOVENOX) injection  190 mg Subcutaneous Q12H  . metoprolol succinate  50 mg Oral Daily  . pantoprazole  40 mg Oral Daily  . sertraline  100 mg Oral Daily  . sodium chloride flush  3 mL Intravenous Q12H  . Warfarin - Pharmacist Dosing Inpatient   Does not apply q1800   Infusions:    Assessment: Holly Rodgers has been on coumadin for her PE. However, she missed the last week or so of because of miss appts. Lovenox was started last PM (12/15) because her INR is 1. Coumadin was ordered to be resumed 12/16. Due to her wt, there may not be good data for DOAC but we may have to use it if she is dc home. INR 1.06 today after 1 dose of coumadin 10mg . Hgb 9.4, plt 301, scr  0.73  PTA coumadin = 7.5mg  qday  Goal of Therapy:  INR 2-3 Monitor platelets by anticoagulation protocol: Yes   Plan:  Coumadin 10mg  PO x1 Daily INR, CBC q3d  Ulyses SouthwardMinh Pham, PharmD, BCIDP, AAHIVP, CPP Infectious Disease Pharmacist 01/23/2018 8:24 AM

## 2018-01-23 NOTE — Discharge Summary (Addendum)
PATIENT DETAILS Name: Holly Rodgers Age: 47 y.o. Sex: female Date of Birth: 07-02-1970 MRN: [id number]. Admitting Physician: Elder Love, MD [id number], Hessie Diener, MD  Admit Date: 01/21/2018 Discharge date: 01/23/2018  Recommendations for Outpatient Follow-up:  1. Follow up with PCP in 1-2 weeks 2. Please obtain BMP/CBC in one week 3. Please check INR in the next 2-3 days, adjust dosing of Coumadin accordingly-once INR is therapeutic between 2-3-stop Lovenox. 4. Please refer patient to outpatient hematology-in the next few months-patient had a submassive pulmonary embolism in November. 5. Incidentally found to have a pulmonary nodule-repeat CT scan in the next 6 to 12 months-deferred to the outpatient setting/PCP.  Admitted From:  Home  Disposition: SNF   Home Health: No  Equipment/Devices: None  Discharge Condition: Stable  CODE STATUS: FULL CODE  Diet recommendation:  Heart Healthy / Carb Modified   Brief Summary: See H&P, Labs, Consult and Test reports for all details in brief, Patient is a 47 y.o. female with history of  morbid obesity recent history of venous thromboembolism (saddle embolus and DVT)-refused SNF during her most recent hospitalization last month for VTE-noncompliant to anticoagulation (claims she ran out of Coumadin)-presented to the hospital with generalized body pains-but mostly worse in her bilateral lower extremities.  She also claimed to have worsening shortness of breath.  See below for further details  Brief Hospital Course: Recent history of submassive pulmonary embolism with left lower extremity DVT: Unfortunately has been off Coumadin for more than 1 week-claims that she was having issues getting her INR checked (unfortunately cannot ambulate).  On admission she was restarted on overlapping Lovenox and Coumadin.  Seen by physical therapy-appears to have significant more generalized weakness than her usual baseline due to debility.   Plans are to discharge to SNF-since she will be going to a skilled nursing facility-continue overlapping Lovenox and Coumadin until INR is therapeutic.  We did contemplate putting her on DOAC's-however due to her body weight/morbid obesity-it was felt that Coumadin would be the most appropriate agent.  However if in the future, she is unable to get her INR checked reliably (morbidly obese/unable to ambulate and leave the house to go to her primary care practitioners office)-then we may have no choice but to use DOAC's (claims Lovenox is not affordable long term)  Hypertension:  Moderately controlled-continue Cardizem and metoprolol.  Other optimization to be done in the outpatient setting.  GERD: Continue PPI  Microcytic anemia: No evidence of overt blood loss-she denies melena/hematochezia-monitor CBC periodically-since this appears to be a chronic issue-further work-up deferred to the outpatient setting.  DM-2: Diet controlled-recent A1c was 7.4-diet controlled-continue to monitor closely in the outpatient setting.   Pulmonary nodule: Incidental finding-history of smoking-needs outpatient follow-up for repeat CT scan in 6 to 12 months.  Generalized weakness/deconditioning/generalized pain: Suspect she has osteoarthritis/DVT causing pain in her lower extremities-her exam is benign-she is morbidly obese-which limits exam.  She at baseline is able to ambulate around the house and was able to go to her doctor's office-however since her most recent hospitalization for pulmonary embolism/DVT-she has developed worsening debility-and can only walk a few steps.  She has been unable to leave the house due to worsening debility/weakness.  Evaluated by PT-recommendations are for SNF.  Morbid obesity  Procedures/Studies: None  Discharge Diagnoses:  Active Problems:   Pulmonary embolism [facility])   Discharge Instructions:  Activity:  As tolerated with Full fall precautions use walker/cane &  assistance as needed  Discharge Instructions  Diet - low sodium heart healthy   Complete by:  As directed    Discharge instructions   Complete by:  As directed    Follow with Primary MD  Kyung Rudd, MD in 1 week  Please ask your primary care practitioner/attending MD recheck INR in 2-3 days-your dosage of Coumadin may need to be adjusted-once your INR is therapeutic (between 2-3) Lovenox can be discontinued.  Please get a complete blood count and chemistry panel checked by your Primary MD at your next visit, and again as instructed by your Primary MD.  Get Medicines reviewed and adjusted: Please take all your medications with you for your next visit with your Primary MD  Laboratory/radiological data: Please request your Primary MD to go over all hospital tests and procedure/radiological results at the follow up, please ask your Primary MD to get all Hospital records sent to his/her office.  In some cases, they will be blood work, cultures and biopsy results pending at the time of your discharge. Please request that your primary care M.D. follows up on these results.  Also Note the following: If you experience worsening of your admission symptoms, develop shortness of breath, life threatening emergency, suicidal or homicidal thoughts you must seek medical attention immediately by calling 911 or calling your MD immediately  if symptoms less severe.  You must read complete instructions/literature along with all the possible adverse reactions/side effects for all the Medicines you take and that have been prescribed to you. Take any new Medicines after you have completely understood and accpet all the possible adverse reactions/side effects.   Do not drive when taking Pain medications or sleeping medications (Benzodaizepines)  Do not take more than prescribed Pain, Sleep and Anxiety Medications. It is not advisable to combine anxiety,sleep and pain medications without talking with your  primary care practitioner  Special Instructions: If you have smoked or chewed Tobacco  in the last 2 yrs please stop smoking, stop any regular Alcohol  and or any Recreational drug use.  Wear Seat belts while driving.  Please note: You were cared for by a hospitalist during your hospital stay. Once you are discharged, your primary care physician will handle any further medical issues. Please note that NO REFILLS for any discharge medications will be authorized once you are discharged, as it is imperative that you return to your primary care physician (or establish a relationship with a primary care physician if you do not have one) for your post hospital discharge needs so that they can reassess your need for medications and monitor your lab values.   Increase activity slowly   Complete by:  As directed      Allergies as of 01/23/2018      Reactions   Metformin    Other reaction(s): GI Upset (intolerance)   Metformin And Related       Medication List    TAKE these medications   acetaminophen 500 MG tablet Commonly known as:  TYLENOL Take 2 tablets (1,000 mg total) by mouth every 8 (eight) hours. What changed:    how much to take  when to take this  reasons to take this   cetirizine 10 MG tablet Commonly known as:  ZYRTEC Take 10 mg by mouth daily.   diltiazem 300 MG 24 hr capsule Commonly known as:  CARDIZEM CD Take 300 mg by mouth daily.   enoxaparin 300 MG/3ML Soln injection Commonly known as:  LOVENOX Inject 1.9 mLs (190 mg total) into the skin every  12 (twelve) hours. What changed:    how much to take  additional instructions   esomeprazole 40 MG capsule Commonly known as:  NEXIUM Take 40 mg by mouth daily at 12 noon.   metoprolol succinate 50 MG 24 hr tablet Commonly known as:  TOPROL-XL Take 50 mg by mouth daily. Take with or immediately following a meal.   polyethylene glycol packet Commonly known as:  MIRALAX / GLYCOLAX Take 17 g by mouth daily as  needed for mild constipation.   pregabalin 225 MG capsule Commonly known as:  LYRICA Take 225 mg by mouth 2 (two) times daily.   sertraline 100 MG tablet Commonly known as:  ZOLOFT Take 100 mg by mouth daily.   tiZANidine 4 MG capsule Commonly known as:  ZANAFLEX Take 4 mg by mouth 4 (four) times daily.   traZODone 50 MG tablet Commonly known as:  DESYREL Take 50 mg by mouth 2 (two) times daily.   Vitamin D3 50 MCG (2000 UT) capsule Take 2,000 Units by mouth daily.   warfarin 5 MG tablet Commonly known as:  COUMADIN Coumadin 10 mg daily on 12/17 and 12/18, from 12/19 resume 7.5 mg of Coumadin daily.  Please get INR checked by attending MD/PCP in 2-3 days and get those adjusted.  You need to be on overlapping Coumadin till your INR is therapeutic (between 2-3). What changed:    medication strength  how much to take  how to take this  when to take this  additional instructions       Allergies  Allergen Reactions  . Metformin     Other reaction(s): GI Upset (intolerance)  . Metformin And Related     Consultations:   None  Other Procedures/Studies: Ct Angio Chest Pe W And/or Wo Contrast  Result Date: 01/21/2018 CLINICAL DATA:  Bilateral lower extremity pain. Recent history of pulmonary emboli and DVT. Recent cessation of Coumadin. EXAM: CT ANGIOGRAPHY CHEST WITH CONTRAST TECHNIQUE: Multidetector CT imaging of the chest was performed using the standard protocol during bolus administration of intravenous contrast. Multiplanar CT image reconstructions and MIPs were obtained to evaluate the vascular anatomy. CONTRAST:  ISOVUE-370 IOPAMIDOL (ISOVUE-370) INJECTION 76% COMPARISON:  CTA chest 12/06/2017 FINDINGS: Cardiovascular: --Pulmonary arteries: Satisfactory contrast bolus. Beam attenuation caused by patient body habitus limits assessment beyond the proximal segmental level.Within the previously mentioned limitation, there is no acute pulmonary embolus. There are  areas of hypoattenuation in some bilateral segmental branches that may indicate a small amount of residual embolus within these arteries, but signal to noise ratio is poor peripherally. No new filling defect. No central filling defect. The main pulmonary artery is mildly enlarged, measuring 3.7 cm. --Aorta: Limited opacification of the aorta due to bolus timing optimization for the pulmonary arteries. Conventional 3 vessel aortic branching pattern. The aortic course and caliber are normal. There is no aortic atherosclerosis. --Heart: Mild cardiomegaly. No pericardial effusion. Mediastinum/Nodes: No mediastinal, hilar or axillary lymphadenopathy. The visualized thyroid and thoracic esophageal course are unremarkable. Lungs/Pleura: No pulmonary nodules or masses. No pleural effusion or pneumothorax. No focal airspace consolidation. No focal pleural abnormality. Upper Abdomen: Contrast bolus timing is not optimized for evaluation of the abdominal organs. Within this limitation, the visualized organs of the upper abdomen are normal. Musculoskeletal: No chest wall abnormality. No acute or significant osseous findings. Review of the MIP images confirms the above findings. IMPRESSION: 1. No central pulmonary embolus. 2. Areas of hypoattenuation in multiple segmental branches may indicate residual embolus, but beam attenuation  due to body habitus limits assessment of these distal arteries due to poor signal-to-noise ratio. 3. Mildly enlarged main pulmonary artery, which may indicate underlying pulmonary hypertension. Electronically Signed   By: Deatra Robinson M.D.   On: 01/21/2018 17:48   Vas [place] Lower Extremity Venous (dvt) (mc And Wl 7a-7p)  Result Date: 01/21/2018  Lower Venous Study Indications: Pain, and History of left popliteal vein DVT 12/06/17.  Limitations: Body habitus. Performing Technologist: Gertie Fey MHA, RDMS, RVT, RDCS  Examination Guidelines: A complete evaluation includes B-mode imaging,  spectral Doppler, color Doppler, and power Doppler as needed of all accessible portions of each vessel. Bilateral testing is considered an integral part of a complete examination. Limited examinations for reoccurring indications may be performed as noted.  Right Venous Findings: +---------+---------------+---------+-----------+----------+------------------+          CompressibilityPhasicitySpontaneityPropertiesSummary            +---------+---------------+---------+-----------+----------+------------------+ CFV      Full           Yes      Yes                                     +---------+---------------+---------+-----------+----------+------------------+ SFJ      Full                                                            +---------+---------------+---------+-----------+----------+------------------+ FV Prox  Full                                                            +---------+---------------+---------+-----------+----------+------------------+ FV Mid   Full                                                            +---------+---------------+---------+-----------+----------+------------------+ FV DistalFull                                                            +---------+---------------+---------+-----------+----------+------------------+ POP      Full           Yes      Yes                                     +---------+---------------+---------+-----------+----------+------------------+ PTV      Full                                         Limited evaluation +---------+---------------+---------+-----------+----------+------------------+ PERO     Full  Limited evaluation +---------+---------------+---------+-----------+----------+------------------+  Left Venous Findings: +---------+---------------+---------+-----------+----------+------------------+           CompressibilityPhasicitySpontaneityPropertiesSummary            +---------+---------------+---------+-----------+----------+------------------+ CFV      Full           Yes      Yes                                     +---------+---------------+---------+-----------+----------+------------------+ SFJ      Full                                                            +---------+---------------+---------+-----------+----------+------------------+ FV Prox  Full                                                            +---------+---------------+---------+-----------+----------+------------------+ FV Mid   Full                                                            +---------+---------------+---------+-----------+----------+------------------+ FV DistalFull                                                            +---------+---------------+---------+-----------+----------+------------------+ POP      None                    No                   Acute              +---------+---------------+---------+-----------+----------+------------------+ PTV      Full                                         Limited evaluation +---------+---------------+---------+-----------+----------+------------------+ PERO     Full                                         Limited evaluation +---------+---------------+---------+-----------+----------+------------------+    Summary: Right: There is no evidence of deep vein thrombosis in the lower extremity. However, portions of this examination were limited- see technologist comments above. No cystic structure found in the popliteal fossa. Left: Findings consistent with acute deep vein thrombosis involving the left popliteal vein. Findings appear essentially unchanged compared to previous examination. No cystic structure found in the popliteal fossa.  *See table(s) above for measurements and observations. Electronically signed by  Waverly Ferrari MD on 01/21/2018 at 5:52:08 PM.    Final  TODAY-DAY OF DISCHARGE:  Subjective:   Emoni Whitworth today has no headache,no chest abdominal pain,no new weakness tingling or numbness, feels much better wants to go home today.   Objective:   Blood pressure (!) 148/85, pulse 85, temperature 98.3 F (36.8 C), temperature source Oral, resp. rate 18, height 5\' 11"  (1.803 m), weight (!) 190.5 kg, last menstrual period 12/11/2017, SpO2 97 %.  Intake/Output Summary (Last 24 hours) at 01/23/2018 1030 Last data filed at 01/23/2018 0600 Gross per 24 hour  Intake 600 ml  Output 6200 ml  Net -5600 ml   Filed Weights   01/21/18 1404 01/23/18 0500  Weight: (!) 190.5 kg (!) 190.5 kg    Exam: Awake Alert, Oriented *3, No new F.N deficits, Normal affect [place].AT,PERRAL Supple Neck,No JVD, No cervical lymphadenopathy appriciated.  Symmetrical Chest wall movement, Good air movement bilaterally, CTAB RRR,No Gallops,Rubs or new Murmurs, No Parasternal Heave +ve B.Sounds, Abd Soft, Non tender, No organomegaly appriciated, No rebound -guarding or rigidity. No Cyanosis, Clubbing or edema, No new Rash or bruise   PERTINENT RADIOLOGIC STUDIES: Ct Angio Chest Pe W And/or Wo Contrast  Result Date: 01/21/2018 CLINICAL DATA:  Bilateral lower extremity pain. Recent history of pulmonary emboli and DVT. Recent cessation of Coumadin. EXAM: CT ANGIOGRAPHY CHEST WITH CONTRAST TECHNIQUE: Multidetector CT imaging of the chest was performed using the standard protocol during bolus administration of intravenous contrast. Multiplanar CT image reconstructions and MIPs were obtained to evaluate the vascular anatomy. CONTRAST:  ISOVUE-370 IOPAMIDOL (ISOVUE-370) INJECTION 76% COMPARISON:  CTA chest 12/06/2017 FINDINGS: Cardiovascular: --Pulmonary arteries: Satisfactory contrast bolus. Beam attenuation caused by patient body habitus limits assessment beyond the proximal segmental level.Within the  previously mentioned limitation, there is no acute pulmonary embolus. There are areas of hypoattenuation in some bilateral segmental branches that may indicate a small amount of residual embolus within these arteries, but signal to noise ratio is poor peripherally. No new filling defect. No central filling defect. The main pulmonary artery is mildly enlarged, measuring 3.7 cm. --Aorta: Limited opacification of the aorta due to bolus timing optimization for the pulmonary arteries. Conventional 3 vessel aortic branching pattern. The aortic course and caliber are normal. There is no aortic atherosclerosis. --Heart: Mild cardiomegaly. No pericardial effusion. Mediastinum/Nodes: No mediastinal, hilar or axillary lymphadenopathy. The visualized thyroid and thoracic esophageal course are unremarkable. Lungs/Pleura: No pulmonary nodules or masses. No pleural effusion or pneumothorax. No focal airspace consolidation. No focal pleural abnormality. Upper Abdomen: Contrast bolus timing is not optimized for evaluation of the abdominal organs. Within this limitation, the visualized organs of the upper abdomen are normal. Musculoskeletal: No chest wall abnormality. No acute or significant osseous findings. Review of the MIP images confirms the above findings. IMPRESSION: 1. No central pulmonary embolus. 2. Areas of hypoattenuation in multiple segmental branches may indicate residual embolus, but beam attenuation due to body habitus limits assessment of these distal arteries due to poor signal-to-noise ratio. 3. Mildly enlarged main pulmonary artery, which may indicate underlying pulmonary hypertension. Electronically Signed   By: Deatra Robinson M.D.   On: 01/21/2018 17:48   Vas [place] Lower Extremity Venous (dvt) (mc And Wl 7a-7p)  Result Date: 01/21/2018  Lower Venous Study Indications: Pain, and History of left popliteal vein DVT 12/06/17.  Limitations: Body habitus. Performing Technologist: Gertie Fey MHA, RDMS, RVT,  RDCS  Examination Guidelines: A complete evaluation includes B-mode imaging, spectral Doppler, color Doppler, and power Doppler as needed of all accessible portions of each vessel. Bilateral testing is considered  an integral part of a complete examination. Limited examinations for reoccurring indications may be performed as noted.  Right Venous Findings: +---------+---------------+---------+-----------+----------+------------------+          CompressibilityPhasicitySpontaneityPropertiesSummary            +---------+---------------+---------+-----------+----------+------------------+ CFV      Full           Yes      Yes                                     +---------+---------------+---------+-----------+----------+------------------+ SFJ      Full                                                            +---------+---------------+---------+-----------+----------+------------------+ FV Prox  Full                                                            +---------+---------------+---------+-----------+----------+------------------+ FV Mid   Full                                                            +---------+---------------+---------+-----------+----------+------------------+ FV DistalFull                                                            +---------+---------------+---------+-----------+----------+------------------+ POP      Full           Yes      Yes                                     +---------+---------------+---------+-----------+----------+------------------+ PTV      Full                                         Limited evaluation +---------+---------------+---------+-----------+----------+------------------+ PERO     Full                                         Limited evaluation +---------+---------------+---------+-----------+----------+------------------+  Left Venous Findings:  +---------+---------------+---------+-----------+----------+------------------+          CompressibilityPhasicitySpontaneityPropertiesSummary            +---------+---------------+---------+-----------+----------+------------------+ CFV      Full           Yes      Yes                                     +---------+---------------+---------+-----------+----------+------------------+  SFJ      Full                                                            +---------+---------------+---------+-----------+----------+------------------+ FV Prox  Full                                                            +---------+---------------+---------+-----------+----------+------------------+ FV Mid   Full                                                            +---------+---------------+---------+-----------+----------+------------------+ FV DistalFull                                                            +---------+---------------+---------+-----------+----------+------------------+ POP      None                    No                   Acute              +---------+---------------+---------+-----------+----------+------------------+ PTV      Full                                         Limited evaluation +---------+---------------+---------+-----------+----------+------------------+ PERO     Full                                         Limited evaluation +---------+---------------+---------+-----------+----------+------------------+    Summary: Right: There is no evidence of deep vein thrombosis in the lower extremity. However, portions of this examination were limited- see technologist comments above. No cystic structure found in the popliteal fossa. Left: Findings consistent with acute deep vein thrombosis involving the left popliteal vein. Findings appear essentially unchanged compared to previous examination. No cystic structure found in the  popliteal fossa.  *See table(s) above for measurements and observations. Electronically signed by Waverly Ferrarihristopher Dickson MD on 01/21/2018 at 5:52:08 PM.    Final      PERTINENT LAB RESULTS: CBC: Recent Labs    01/22/18 0231 01/23/18 0241  WBC 9.1 8.2  HGB 9.4* 9.4*  HCT 31.8* 31.8*  PLT 316 301   CMET CMP     Component Value Date/Time   NA 139 01/22/2018 0231   K 3.5 01/22/2018 0231   CL 98 01/22/2018 0231   CO2 30 01/22/2018 0231   GLUCOSE 111 (H) 01/22/2018 0231   BUN 7 01/22/2018 0231   CREATININE 0.73 01/22/2018 0231   CALCIUM 8.9 01/22/2018 0231  GFRNONAA >60 01/22/2018 0231   GFRAA >60 01/22/2018 0231    GFR Estimated Creatinine Clearance: 162.9 mL/min (by C-G formula based on SCr of 0.73 mg/dL). No results for input(s): LIPASE, AMYLASE in the last 72 hours. No results for input(s): CKTOTAL, CKMB, CKMBINDEX, TROPONINI in the last 72 hours. Invalid input(s): POCBNP No results for input(s): DDIMER in the last 72 hours. No results for input(s): HGBA1C in the last 72 hours. No results for input(s): CHOL, HDL, LDLCALC, TRIG, CHOLHDL, LDLDIRECT in the last 72 hours. No results for input(s): TSH, T4TOTAL, T3FREE, THYROIDAB in the last 72 hours.  Invalid input(s): FREET3 Recent Labs    01/22/18 0231  FERRITIN 8*  TIBC 417  IRON 45  RETICCTPCT 1.4   Coags: Recent Labs    01/21/18 1409 01/23/18 0241  INR 1.04 1.06   Microbiology: No results found for this or any previous visit (from the past 240 hour(s)).  FURTHER DISCHARGE INSTRUCTIONS:  Get Medicines reviewed and adjusted: Please take all your medications with you for your next visit with your Primary MD  Laboratory/radiological data: Please request your Primary MD to go over all hospital tests and procedure/radiological results at the follow up, please ask your Primary MD to get all Hospital records sent to his/her office.  In some cases, they will be blood work, cultures and biopsy results pending at  the time of your discharge. Please request that your primary care M.D. goes through all the records of your hospital data and follows up on these results.  Also Note the following: If you experience worsening of your admission symptoms, develop shortness of breath, life threatening emergency, suicidal or homicidal thoughts you must seek medical attention immediately by calling 911 or calling your MD immediately  if symptoms less severe.  You must read complete instructions/literature along with all the possible adverse reactions/side effects for all the Medicines you take and that have been prescribed to you. Take any new Medicines after you have completely understood and accpet all the possible adverse reactions/side effects.   Do not drive when taking Pain medications or sleeping medications (Benzodaizepines)  Do not take more than prescribed Pain, Sleep and Anxiety Medications. It is not advisable to combine anxiety,sleep and pain medications without talking with your primary care practitioner  Special Instructions: If you have smoked or chewed Tobacco  in the last 2 yrs please stop smoking, stop any regular Alcohol  and or any Recreational drug use.  Wear Seat belts while driving.  Please note: You were cared for by a hospitalist during your hospital stay. Once you are discharged, your primary care physician will handle any further medical issues. Please note that NO REFILLS for any discharge medications will be authorized once you are discharged, as it is imperative that you return to your primary care physician (or establish a relationship with a primary care physician if you do not have one) for your post hospital discharge needs so that they can reassess your need for medications and monitor your lab values.  Total Time spent coordinating discharge including counseling, education and face to face time equals 35 minutes.  Signed: Nelson Noone 01/23/2018 10:30 AM

## 2018-01-23 NOTE — Clinical Social Work Note (Addendum)
Pt has selected [facility]. [facility] has order bari bed and RW. [employer] auth has been started at this time.   4[facility]00 [facility] and RW will be delivered at facility tomorrow. Pt can transfer to facility tomorrow. CSW will follow up with RN on 12/18 with determined pick up time.  [place]arlboroBridget Alphonsa Brickle, [place] [phone]

## 2018-01-24 DIAGNOSIS — R0602 Shortness of breath: Secondary | ICD-10-CM

## 2018-01-24 LAB — PROTIME-INR
INR: 1.24
Prothrombin Time: 15.5 seconds — ABNORMAL HIGH (ref 11.4–15.2)

## 2018-01-24 MED ORDER — WARFARIN SODIUM 10 MG PO TABS
10.0000 mg | ORAL_TABLET | Freq: Once | ORAL | Status: AC
  Administered 2018-01-24: 10 mg via ORAL
  Filled 2018-01-24: qty 1

## 2018-01-24 NOTE — Progress Notes (Signed)
ANTICOAGULATION CONSULT NOTE - Initial Consult  Pharmacy Consult for coumadin Indication: pulmonary embolus and DVT  Allergies  Allergen Reactions  . Metformin     Other reaction(s): GI Upset (intolerance)  . Metformin And Related     Patient Measurements: Height: 5\' 11"  (180.3 cm) Weight: (!) 419 lb 15.6 oz (190.5 kg) IBW/kg (Calculated) : 70.8 Heparin Dosing Weight:   Vital Signs: Temp: 98.3 F (36.8 C) (12/17 2243) Temp Source: Oral (12/17 2243) BP: 139/80 (12/17 2243) Pulse Rate: 77 (12/17 2243)  Labs: Recent Labs    01/21/18 1409 01/22/18 0231 01/23/18 0241 01/24/18 0256  HGB 9.8* 9.4* 9.4*  --   HCT 35.2* 31.8* 31.8*  --   PLT 344 316 301  --   LABPROT 13.5  --  13.7 15.5*  INR 1.04  --  1.06 1.24  CREATININE 0.77 0.73  --   --     Estimated Creatinine Clearance: 162.9 mL/min (by C-G formula based on SCr of 0.73 mg/dL).   Medical History: Past Medical History:  Diagnosis Date  . Anxiety   . Chronic pain   . Cyst   . Depression   . Diabetes mellitus without complication (HCC)   . Hearing loss   . Hypertension   . Idiopathic dilatation of pulmonary artery (HCC)   . Insomnia   . Snoring   . Tinnitus   . Vertigo     Medications:  Scheduled:  . acetaminophen  1,000 mg Oral Q8H  . diltiazem  300 mg Oral Daily  . enoxaparin (LOVENOX) injection  190 mg Subcutaneous Q12H  . metoprolol succinate  50 mg Oral Daily  . pantoprazole  40 mg Oral Daily  . sertraline  100 mg Oral Daily  . sodium chloride flush  3 mL Intravenous Q12H  . Warfarin - Pharmacist Dosing Inpatient   Does not apply q1800   Infusions:    Assessment: Lorene DyChristie has been on coumadin for her PE. However, she missed the last week or so of because of miss appts. Lovenox was started last PM (12/15) because her INR is 1. Coumadin was ordered to be resumed 12/16. Due to her wt, there may not be good data for DOAC but we may have to use it if she is dc home.   INR trended up slightly to  1.24 after 2 doses of 10mg . She will likely need 1-2 more higher doses before transitioning back to the previous home dose. She is likely to be tx to SNF today.   PTA coumadin = 7.5mg  qday  Goal of Therapy:  INR 2-3 Monitor platelets by anticoagulation protocol: Yes   Plan:  Continue Lovenox 190mg  SQ BID Coumadin 10mg  PO x1 (one or two more days before back to home dose based on INR) Daily INR, CBC q3d  Ulyses SouthwardMinh , PharmD, BCIDP, AAHIVP, CPP Infectious Disease Pharmacist 01/24/2018 8:28 AM

## 2018-01-24 NOTE — Progress Notes (Signed)
Patient has been seen and evaluated this morning and was stabilized for discharge with no acute changes anticipate discharge later today to skilled facility

## 2018-01-24 NOTE — Progress Notes (Signed)
PT Cancellation Note  Patient Details Name: Holly HoppingChristie M Mentor MRN: [id number] DOB: 02/01/1971   Cancelled Treatment:    Reason Eval/Treat Not Completed: Other (comment).  Pt is complaining of nausea, has received some meds and obtained a gingerale for her.  Pt asks to wait for therapy, and will try again tomorrow if she does not leave today.   Ivar DrapeRuth E Livan Hires 01/24/2018, 2:47 PM   Samul Dadauth Raju Coppolino, PT MS Acute Rehab Dept. Number: [facility] [phone] and [facility] [phone]

## 2018-01-25 LAB — PROTIME-INR
INR: 1.71
Prothrombin Time: 19.8 seconds — ABNORMAL HIGH (ref 11.4–15.2)

## 2018-01-25 MED ORDER — PROMETHAZINE HCL 25 MG PO TABS
25.0000 mg | ORAL_TABLET | Freq: Four times a day (QID) | ORAL | Status: DC | PRN
  Administered 2018-01-25: 25 mg via ORAL
  Filled 2018-01-25: qty 1

## 2018-01-25 MED ORDER — PROMETHAZINE HCL 25 MG PO TABS: 25.0000 mg | ORAL_TABLET | Freq: Four times a day (QID) | ORAL | 0 refills | Status: AC | PRN

## 2018-01-25 MED ORDER — WARFARIN SODIUM 7.5 MG PO TABS: 7.5000 mg | ORAL_TABLET | Freq: Every day | ORAL | Status: DC

## 2018-01-25 NOTE — Clinical Social Work Note (Signed)
Pt will d/c to [facility] today as [facility] CSW will notify RN when we are cleared to send Pt.  [place]edgefieldBridget Abbigayle Toole, [place] [phone]

## 2018-01-25 NOTE — Progress Notes (Signed)
No changes patient remained stable for discharge awaiting bed as per social worker

## 2018-01-25 NOTE — Clinical Social Work Note (Addendum)
Clinical Social Worker facilitated patient discharge including contacting patient family and facility to confirm patient discharge plans.  Clinical information faxed to facility and family agreeable with plan.  CSW arranged ambulance transport via [facility] (3:00) to [facility].  RN to call [phone] for report prior to discharge.  Clinical Social Worker will sign off for now as social work intervention is no longer needed. Please consult us again if new need arises.  [place] SalemBridget Yehudis Monceaux, [place] [phone]

## 2018-01-25 NOTE — Progress Notes (Signed)
ANTICOAGULATION CONSULT NOTE - Initial Consult  Pharmacy Consult for coumadin Indication: pulmonary embolus and DVT  Allergies  Allergen Reactions  . Metformin     Other reaction(s): GI Upset (intolerance)  . Metformin And Related     Patient Measurements: Height: 5\' 11"  (180.3 cm) Weight: (!) 422 lb 14.4 oz (191.8 kg) IBW/kg (Calculated) : 70.8 Heparin Dosing Weight:   Vital Signs: Temp: 98.2 F (36.8 C) (12/19 0821) Temp Source: Oral (12/19 0821) BP: 153/76 (12/19 0821) Pulse Rate: 91 (12/19 0821)  Labs: Recent Labs    01/23/18 0241 01/24/18 0256 01/25/18 0251  HGB 9.4*  --   --   HCT 31.8*  --   --   PLT 301  --   --   LABPROT 13.7 15.5* 19.8*  INR 1.06 1.24 1.71    Estimated Creatinine Clearance: 163.6 mL/min (by C-G formula based on SCr of 0.73 mg/dL).   Medical History: Past Medical History:  Diagnosis Date  . Anxiety   . Chronic pain   . Cyst   . Depression   . Diabetes mellitus without complication (HCC)   . Hearing loss   . Hypertension   . Idiopathic dilatation of pulmonary artery (HCC)   . Insomnia   . Snoring   . Tinnitus   . Vertigo     Medications:  Scheduled:  . acetaminophen  1,000 mg Oral Q8H  . diltiazem  300 mg Oral Daily  . enoxaparin (LOVENOX) injection  190 mg Subcutaneous Q12H  . metoprolol succinate  50 mg Oral Daily  . pantoprazole  40 mg Oral Daily  . sertraline  100 mg Oral Daily  . sodium chloride flush  3 mL Intravenous Q12H  . Warfarin - Pharmacist Dosing Inpatient   Does not apply q1800   Infusions:    Assessment: Holly Rodgers has been on coumadin for her PE. However, she missed the last week or so of because of miss appts. Lovenox was started last PM (12/15) because her INR is 1. Coumadin was ordered to be resumed 12/16. Due to her wt, there may not be good data for DOAC but we may have to use it if she is dc home.   INR increased to 1.71 after 3 doses of 10mg . Anticipating going to [facility] today. We will  resume home dose. Check daily INR for the next few days to adjust coumadin. Will likely need lovenox for the next 1-2 days.   PTA coumadin = 7.5mg  qday  Goal of Therapy:  INR 2-3 Monitor platelets by anticoagulation protocol: Yes   Plan:  Continue Lovenox 190mg  SQ BID for bridging Resume coumadin 7.5mg  qday Daily INR  Ulyses SouthwardMinh Aubreigh Fuerte, PharmD, BCIDP, AAHIVP, CPP Infectious Disease Pharmacist 01/25/2018 8:33 AM

## 2018-01-25 NOTE — Progress Notes (Signed)
Physical Therapy Treatment Patient Details Name: Holly Rodgers MRN: [id number] DOB: 07/18/1970 Today's Date: 01/25/2018    History of Present Illness Holly Rodgers is a 47yo [facility] 8D unable to resume prescribed anticoagulation (transportation limitations), worsening BLE pain, foudn to have a new DVT. PMH: PE/DVT, morbid obesity, HTN, DM.     PT Comments    Patient with c/o of diarrhea and nausea today, requests limited session. [facility] cues for correcting posture to decrease risk of falling during transfer. Min Guard at this time, ambulation deferred. Will cont to follow, agree with SNF recs.      Follow Up Recommendations  SNF;Supervision for mobility/OOB     Equipment Recommendations  None recommended by PT    Recommendations for Other Services       Precautions / Restrictions Precautions Precautions: Fall Restrictions Weight Bearing Restrictions: No    Mobility  Bed Mobility               General bed mobility comments: OOB at entry  Transfers Overall transfer level: Needs assistance   Transfers: Sit to/from Stand;Stand Pivot Transfers Sit to Stand: Min guard Stand pivot transfers: Min guard       General transfer comment: Pt standing without dizziness today, pivoting to commode as she has had dirrhea    Ambulation/Gait                 Stairs             Wheelchair Mobility    Modified Rankin (Stroke Patients Only)       Balance                                            Cognition Arousal/Alertness: Awake/alert Behavior During Therapy: WFL for tasks assessed/performed Overall Cognitive Status: Within Functional Limits for tasks assessed                                        Exercises      General Comments        Pertinent Vitals/Pain Pain Assessment: No/denies pain    Home Living                      Prior  Function            PT Goals (current goals can now be found in the care plan section) Acute Rehab PT Goals Patient Stated Goal: decrease nausea and dizziness and leg pain  PT Goal Formulation: With patient Time For Goal Achievement: 02/05/18 Potential to Achieve Goals: Good Progress towards PT goals: Progressing toward goals    Frequency    Min 3X/week      PT Plan Current plan remains appropriate    Co-evaluation              AM-PAC PT "6 Clicks" Mobility   Outcome Measure  Help needed turning from your back to your side while in a flat bed without using bedrails?: A Little Help needed moving from lying on your back to sitting on the side of a flat bed without using bedrails?: A Little Help needed moving to and from a bed to a chair (including a wheelchair)?: A Little Help needed standing  up from a chair using your arms (e.g., wheelchair or bedside chair)?: A Little Help needed to walk in hospital room?: A Lot Help needed climbing 3-5 steps with a railing? : Total 6 Click Score: 15    End of Session Equipment Utilized During Treatment: Gait belt Activity Tolerance: Patient tolerated treatment well;Treatment limited secondary to medical complications (Comment) Patient left: in chair;with call bell/phone within reach Nurse Communication: Other (comment) PT Visit Diagnosis: Unsteadiness on feet (R26.81);Other abnormalities of gait and mobility (R26.89);Muscle weakness (generalized) (M62.81);Difficulty in walking, not elsewhere classified (R26.2);Dizziness and giddiness (R42)     Time: [id number] PT Time Calculation (min) (ACUTE ONLY): 18 min  Charges:  $Therapeutic Activity: 8-22 mins                     Etta GrandchildSean Antinio Sanderfer, PT, DPT Acute Rehabilitation Services Pager: [phone] Office: [phone]     Etta GrandchildSean Jonnae Fonseca 01/25/2018, 1:43 PM

## 2018-03-15 ENCOUNTER — Inpatient Hospital Stay (HOSPITAL_COMMUNITY): Admit: 2018-03-15 | Payer: Self-pay | Admitting: Psychiatry

## 2019-07-12 IMAGING — CT CT ANGIO CHEST
2 of 6 series · 18 of 36 positions shown · IV contrast (iopamidol)
Comparison: CTA chest 12/06/2017

CLINICAL DATA: Bilateral lower extremity pain. Recent history of
pulmonary emboli and DVT. Recent cessation of Coumadin.

EXAM:
CT ANGIOGRAPHY CHEST WITH CONTRAST
TECHNIQUE: Multidetector CT imaging of the chest was performed using the
standard protocol during bolus administration of intravenous
contrast. Multiplanar CT image reconstructions and MIPs were
obtained to evaluate the vascular anatomy.
CONTRAST:  100mL JDMC2L-UAG IOPAMIDOL (JDMC2L-UAG) INJECTION 76%

[Series 9: pe thins · axial · 0.91mm/px · z∈[+1240,+1481]mm · 17 of 384 slices shown]
[im 20/384  lung]
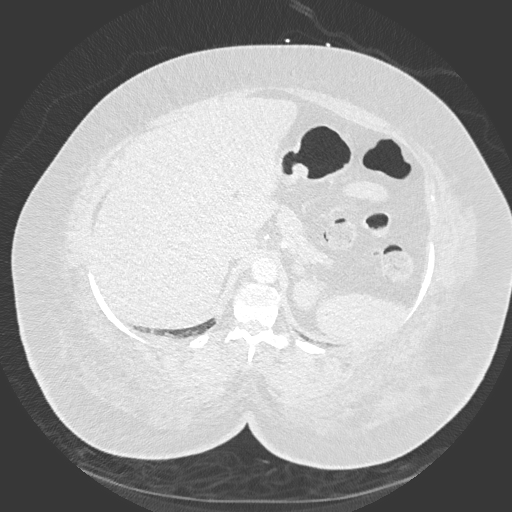
[im 39/384  mediastinal]
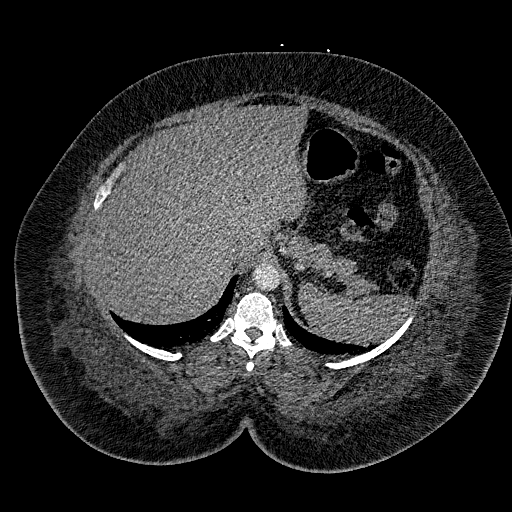
[im 58/384  lung]
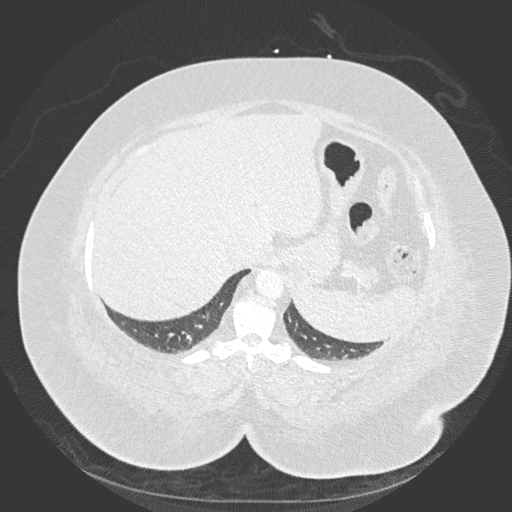
[im 77/384  mediastinal]
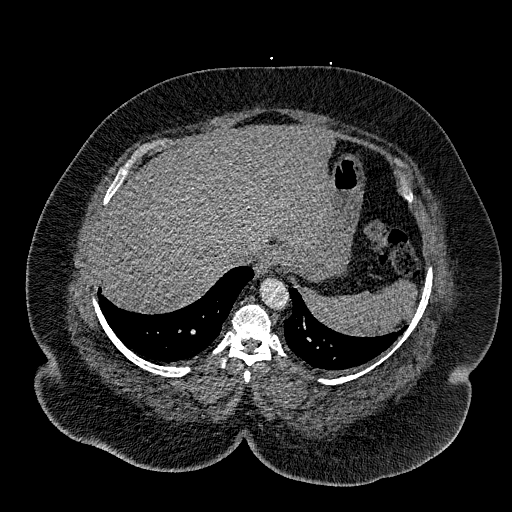
[im 115/384  lung]
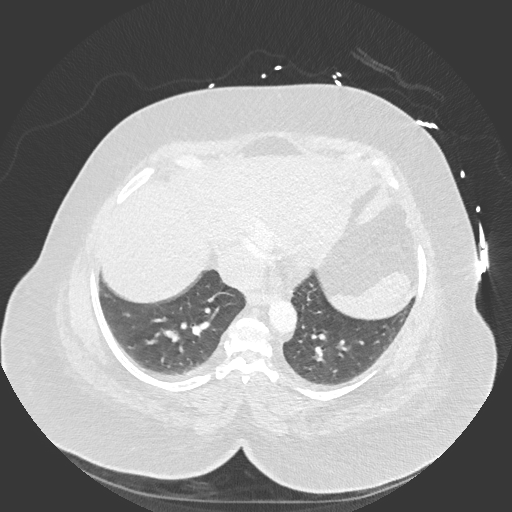
[im 135/384  mediastinal]
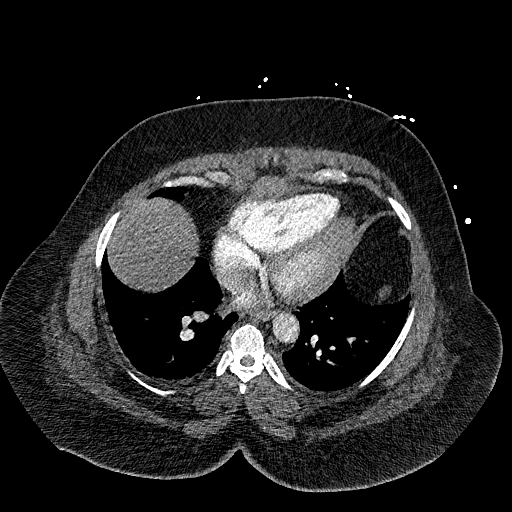
[im 154/384  lung]
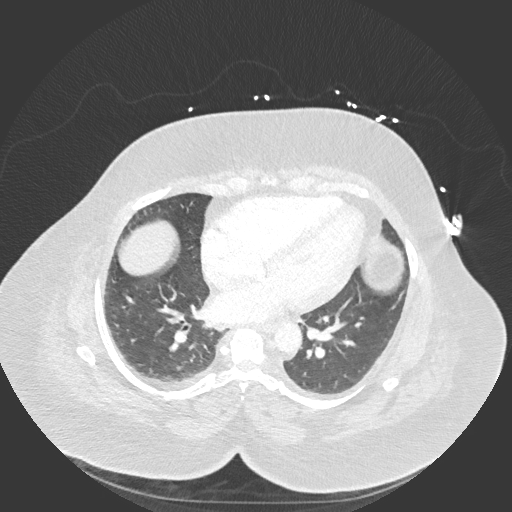
[im 173/384  mediastinal]
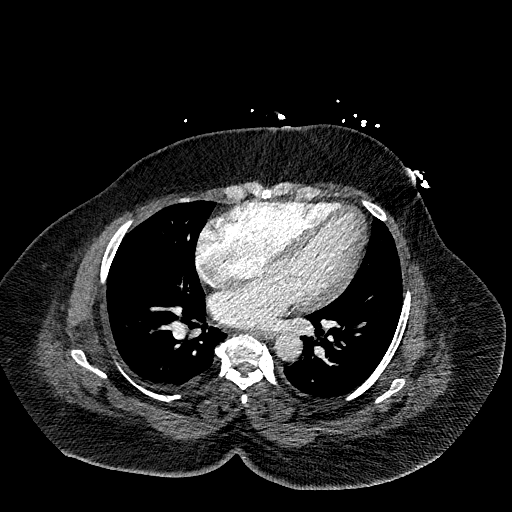
[im 192/384  lung]
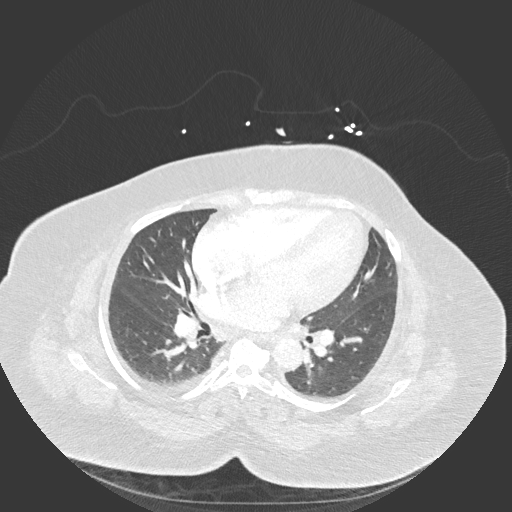
[im 211/384  mediastinal]
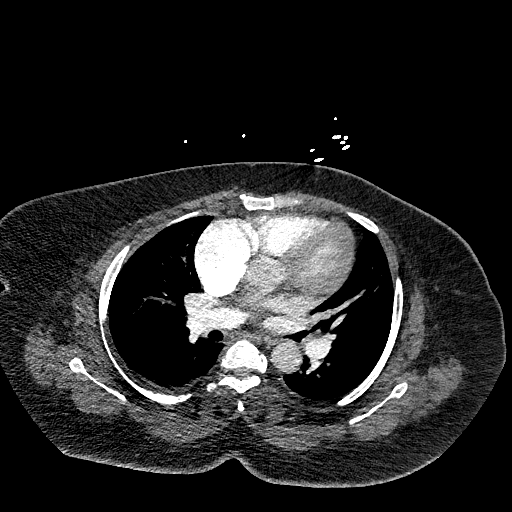
[im 230/384  lung]
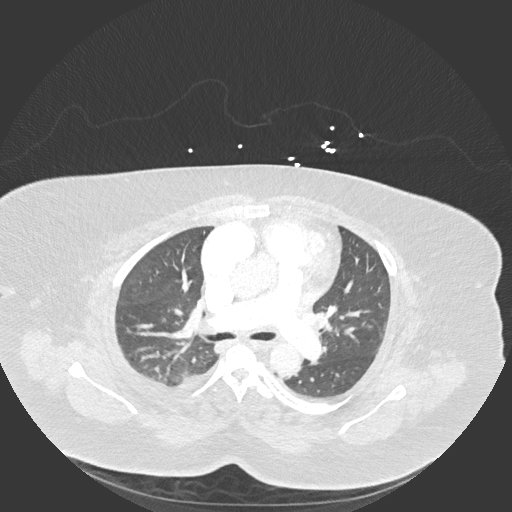
[im 249/384  mediastinal]
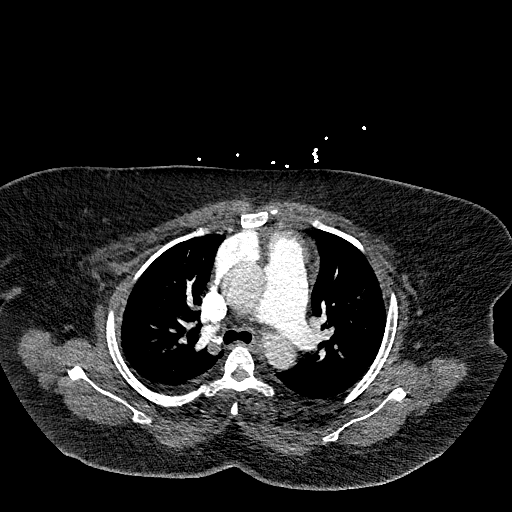
[im 269/384  lung]
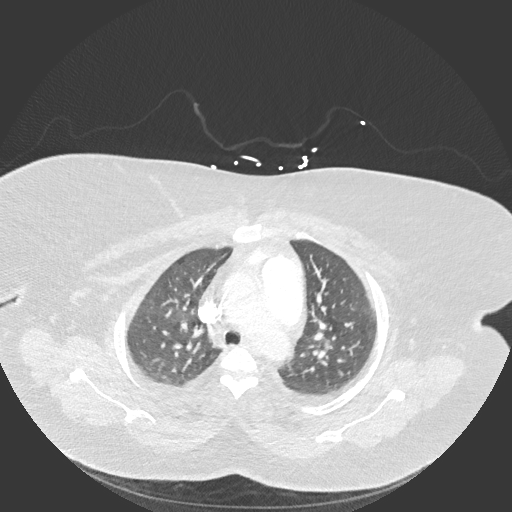
[im 307/384  mediastinal]
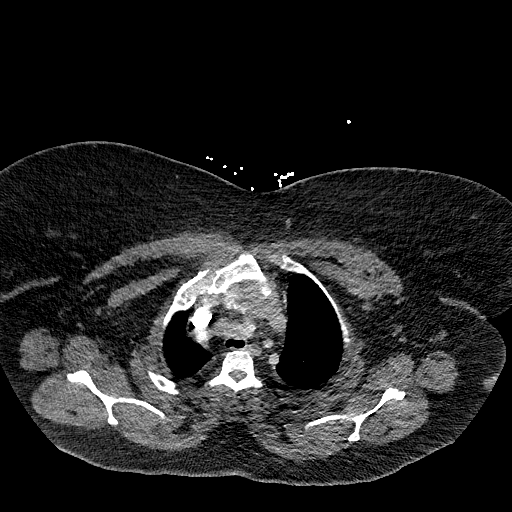
[im 326/384  lung]
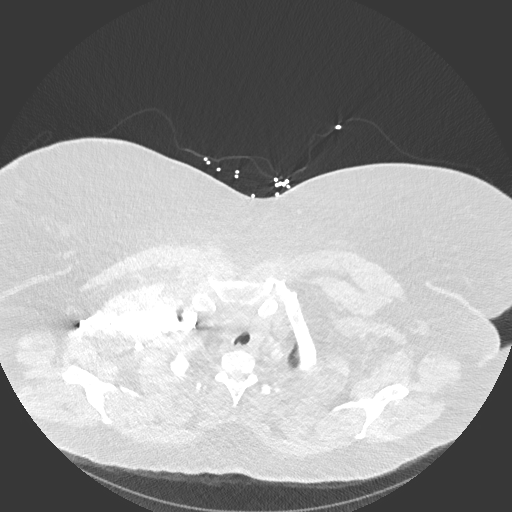
[im 345/384  mediastinal]
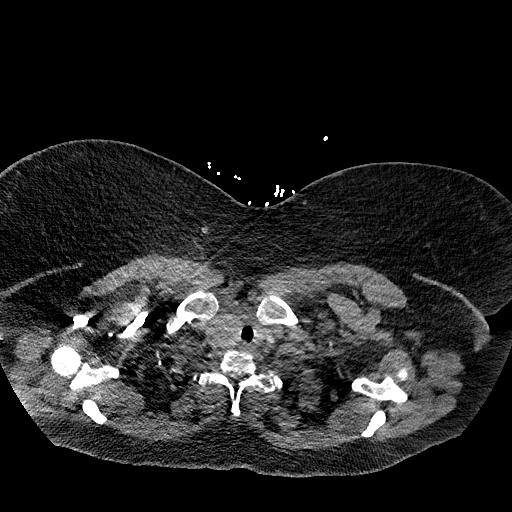
[im 364/384  lung]
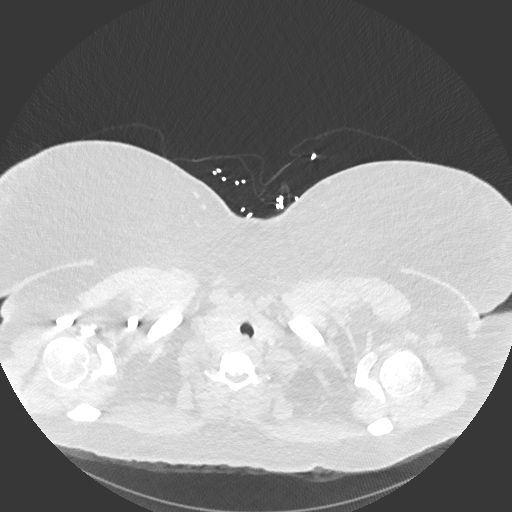

[Series 10: pe 2mm cor · coronal · 0.54mm/px · 1 of 151 slices shown]
[im 76/151  mediastinal]
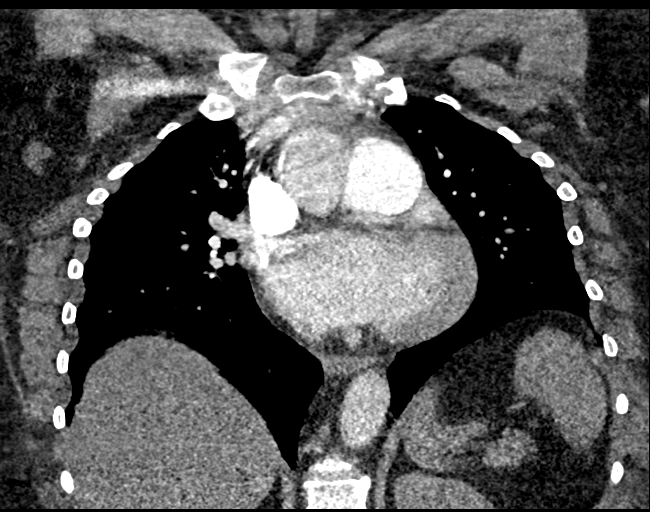

[18 of 36 positions shown; findings below may reference images not displayed]

FINDINGS: Cardiovascular:

--Pulmonary arteries: Satisfactory contrast bolus. Beam attenuation
caused by patient body habitus limits assessment beyond the proximal
segmental level.Within the previously mentioned limitation, there is
no acute pulmonary embolus. There are areas of hypoattenuation in
some bilateral segmental branches that may indicate a small amount
of residual embolus within these arteries, but signal to noise ratio
is poor peripherally. No new filling defect. No central filling
defect. The main pulmonary artery is mildly enlarged, measuring
cm.

--Aorta: Limited opacification of the aorta due to bolus timing
optimization for the pulmonary arteries. Conventional 3 vessel
aortic branching pattern. The aortic course and caliber are normal.
There is no aortic atherosclerosis.

--Heart: Mild cardiomegaly. No pericardial effusion.

Mediastinum/Nodes: No mediastinal, hilar or axillary
lymphadenopathy. The visualized thyroid and thoracic esophageal
course are unremarkable.

Lungs/Pleura: No pulmonary nodules or masses. No pleural effusion or
pneumothorax. No focal airspace consolidation. No focal pleural
abnormality.

Upper Abdomen: Contrast bolus timing is not optimized for evaluation
of the abdominal organs. Within this limitation, the visualized
organs of the upper abdomen are normal.

Musculoskeletal: No chest wall abnormality. No acute or significant
osseous findings.

Review of the MIP images confirms the above findings.
IMPRESSION: 1. No central pulmonary embolus.
2. Areas of hypoattenuation in multiple segmental branches may
indicate residual embolus, but beam attenuation due to body habitus
limits assessment of these distal arteries due to poor
signal-to-noise ratio.
3. Mildly enlarged main pulmonary artery, which may indicate
underlying pulmonary hypertension.
